# Patient Record
Sex: Female | Born: 1960 | Hispanic: Yes | Marital: Married | Smoking: Never smoker
Health system: Southern US, Community
[De-identification: ages and names within clinical notes are randomized; demographics above are authoritative.]

## PROBLEM LIST (undated history)

## (undated) DIAGNOSIS — K851 Biliary acute pancreatitis without necrosis or infection: Secondary | ICD-10-CM

## (undated) DIAGNOSIS — E785 Hyperlipidemia, unspecified: Secondary | ICD-10-CM

## (undated) DIAGNOSIS — K219 Gastro-esophageal reflux disease without esophagitis: Secondary | ICD-10-CM

## (undated) DIAGNOSIS — R7303 Prediabetes: Secondary | ICD-10-CM

## (undated) HISTORY — DX: Biliary acute pancreatitis without necrosis or infection: K85.10

## (undated) HISTORY — DX: Hyperlipidemia, unspecified: E78.5

---

## 2006-01-06 ENCOUNTER — Ambulatory Visit: Payer: Self-pay | Admitting: Family Medicine

## 2006-12-30 ENCOUNTER — Ambulatory Visit: Payer: Self-pay | Admitting: Family Medicine

## 2007-01-18 ENCOUNTER — Ambulatory Visit: Payer: Self-pay | Admitting: Family Medicine

## 2007-03-10 ENCOUNTER — Encounter: Payer: Self-pay | Admitting: Psychiatry

## 2007-04-05 ENCOUNTER — Encounter: Payer: Self-pay | Admitting: Psychiatry

## 2008-03-08 ENCOUNTER — Ambulatory Visit: Payer: Self-pay | Admitting: Family Medicine

## 2009-03-14 ENCOUNTER — Ambulatory Visit: Payer: Self-pay | Admitting: Family Medicine

## 2009-04-05 ENCOUNTER — Ambulatory Visit: Payer: Self-pay | Admitting: Family Medicine

## 2010-05-09 ENCOUNTER — Ambulatory Visit: Payer: Self-pay | Admitting: Family Medicine

## 2011-08-21 ENCOUNTER — Ambulatory Visit: Payer: Self-pay | Admitting: Orthopedic Surgery

## 2011-10-15 ENCOUNTER — Ambulatory Visit: Payer: Self-pay | Admitting: Unknown Physician Specialty

## 2011-10-15 LAB — URINALYSIS, COMPLETE
Bilirubin,UR: NEGATIVE
Glucose,UR: NEGATIVE mg/dL (ref 0–75)
Ketone: NEGATIVE
Nitrite: NEGATIVE
Ph: 6 (ref 4.5–8.0)
Protein: NEGATIVE
RBC,UR: 7 /HPF (ref 0–5)
Squamous Epithelial: 2

## 2011-10-15 LAB — CBC
HGB: 12.8 g/dL (ref 12.0–16.0)
MCH: 29.7 pg (ref 26.0–34.0)
RBC: 4.31 10*6/uL (ref 3.80–5.20)
WBC: 6.6 10*3/uL (ref 3.6–11.0)

## 2011-10-15 LAB — PREGNANCY, URINE: Pregnancy Test, Urine: NEGATIVE m[IU]/mL

## 2011-10-19 ENCOUNTER — Ambulatory Visit: Payer: Self-pay | Admitting: Unknown Physician Specialty

## 2011-10-19 HISTORY — PX: OTHER SURGICAL HISTORY: SHX169

## 2012-02-11 ENCOUNTER — Ambulatory Visit: Payer: Self-pay | Admitting: Family Medicine

## 2012-04-02 ENCOUNTER — Ambulatory Visit: Payer: Self-pay | Admitting: Physical Medicine and Rehabilitation

## 2013-02-13 ENCOUNTER — Ambulatory Visit: Payer: Self-pay | Admitting: Family Medicine

## 2014-06-28 DIAGNOSIS — K219 Gastro-esophageal reflux disease without esophagitis: Secondary | ICD-10-CM | POA: Insufficient documentation

## 2014-06-28 DIAGNOSIS — M778 Other enthesopathies, not elsewhere classified: Secondary | ICD-10-CM | POA: Insufficient documentation

## 2014-06-28 DIAGNOSIS — E785 Hyperlipidemia, unspecified: Secondary | ICD-10-CM | POA: Insufficient documentation

## 2014-07-19 DIAGNOSIS — M5020 Other cervical disc displacement, unspecified cervical region: Secondary | ICD-10-CM | POA: Insufficient documentation

## 2014-07-19 DIAGNOSIS — M5412 Radiculopathy, cervical region: Secondary | ICD-10-CM | POA: Insufficient documentation

## 2015-01-27 NOTE — Op Note (Signed)
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   PROCEDURE: Arthroscopic subacromial decompression along with rotator cuff Topaz procedure and lateral clavicle resection.   SURGEON: Alda Berthold., MD  ANESTHESIA: General.   HISTORY: The patient had sustained an injury to her right shoulder at work. She had had persistent pain. Plain films did not reveal any bony abnormality other than some AC joint degenerative changes. MRI was consistent with tendinosis of her supraspinatus tendon and possible small articular sided cuff tear along with rather severe AC joint traumatic or degenerative changes.   Because the patient had been refractory to conservative treatment, she was brought in for arthroscopy of her right shoulder.   Incidentally her AC joint was injected with steroid and anesthetic and this relieved a good bit of her symptomatology.   DESCRIPTION OF PROCEDURE: Patient was taken to Operating Room where satisfactory general anesthesia was achieved. The patient was turned to the lateral decubitus position with the right shoulder up. The right shoulder was prepped and draped in the usual fashion for an arthroscopic procedure. The shoulder was suspended with the Acufex shoulder suspension device. Shoulder was maintained in about 25 degrees of abduction and about 10 degrees of forward flexion. We used 7 pounds of traction throughout the procedure.   Incidentally the patient was given 2 grams of Kefzol IV prior to the start of the procedure.   The scope was introduced through a posterior portal into the glenohumeral joint. Joint was distended with lactated Ringer's. We used a Mitek pump to facilitate joint distention.   Inspection  of the glenohumeral joint revealed the labrum was intact. The biceps tendon was intact. There seemed to be some fraying in the area of the subscapularis attachment. The articular surfaces did seem to be normal, however. No loose bodies were appreciated. No obvious rotator cuff tear was appreciated.   I established an anterior portal from outside in and introduced a small shaver and used it to debride frayed portion of the subscapularis. I probed the anterior labrum. It appeared to be intact. Biceps tendon was also probed and there was no evidence for tendinopathy.   I then removed the scope from the glenohumeral joint and inserted it into the subacromial space. Patient was noted to have some fraying of the supraspinatus portion of the cuff. There appeared to be a small projection from the inferior aspect of the anterior acromion.   I established a lateral portal and introduced a shaver and debrided some of the thickened bursal tissue and debrided the frayed cuff. An angled ArthroCare wand was used to remove soft tissue from the undersurface of the acromion. The scope was switched to the lateral portal and brought an acromionizer bur in through the posterior portal and used it to perform a subacromial decompression. After this was performed the scope was switched back to the posterior portal and I brought the Topaz wand in through the lateral portal and used it to make multiple small puncture wounds in the area of the frayed supraspinatus insertion.   The cannulas were then removed from the subacromial space and the puncture wounds about the shoulder closed with 3-0 nylon in vertical mattress fashion. I then made about a 1.5-inch incision over the superior aspect of the [facility] joint. Dissection was carried  down through the subcutaneous tissue onto the lateral clavicle. I elevated the periosteum off the lateral clavicle and then used a reciprocating saw to remove about 1 to 1.5 cm of the lateral clavicle at the [facility]  joint. After the [facility] joint was decompressed the periosteum was closed with 0 Vicryl and the sub-Q with 2-0 Vicryl and the skin with skin staples.   Prior to the [facility] joint incision I did inject the area of incision with 4 to 5 mL of 0.5% Marcaine with epinephrine.   I also injected the puncture wounds with several milliliters of 0.5% Marcaine. Betadine was applied to the wounds followed by a sterile dressing. Four TENS pads were placed about the shoulder and then a sling was applied to the patient's right upper extremity. She was then turned supine and awakened and she was transferred to her stretcher bed and taken to recovery room in satisfactory condition. Blood loss was negligible.    ____________________________ Alda BertholdHarold B. Ingeborg Fite Jr., MD hbk:cms D: 10/19/2011 22:02:12 ET T: 10/20/2011 11:12:05 ET JOB#: [id number]  cc: Alda BertholdHarold B. Verdine Grenfell Jr., MD, <Dictator>  Randon GoldsmithHAROLD B Annina Piotrowski, Montez HagemanJR MD ELECTRONICALLY SIGNED 11/01/2011 15:14

## 2016-08-26 ENCOUNTER — Other Ambulatory Visit: Payer: Self-pay | Admitting: Family Medicine

## 2016-08-26 DIAGNOSIS — Z1231 Encounter for screening mammogram for malignant neoplasm of breast: Secondary | ICD-10-CM

## 2016-10-13 ENCOUNTER — Ambulatory Visit
Admission: RE | Admit: 2016-10-13 | Discharge: 2016-10-13 | Disposition: A | Discharge status: Home / Self Care | Payer: BLUE CROSS/BLUE SHIELD | Source: Ambulatory Visit | Attending: Family Medicine | Admitting: Family Medicine

## 2016-10-13 DIAGNOSIS — Z1231 Encounter for screening mammogram for malignant neoplasm of breast: Secondary | ICD-10-CM | POA: Insufficient documentation

## 2016-10-15 ENCOUNTER — Other Ambulatory Visit: Payer: Self-pay | Admitting: Family Medicine

## 2016-10-15 DIAGNOSIS — R928 Other abnormal and inconclusive findings on diagnostic imaging of breast: Secondary | ICD-10-CM

## 2016-10-29 ENCOUNTER — Encounter: Payer: Self-pay | Admitting: Radiology

## 2016-10-29 ENCOUNTER — Ambulatory Visit
Admission: RE | Admit: 2016-10-29 | Discharge: 2016-10-29 | Disposition: A | Discharge status: Home / Self Care | Payer: BLUE CROSS/BLUE SHIELD | Source: Ambulatory Visit | Attending: Family Medicine | Admitting: Family Medicine

## 2016-10-29 DIAGNOSIS — R928 Other abnormal and inconclusive findings on diagnostic imaging of breast: Secondary | ICD-10-CM | POA: Insufficient documentation

## 2018-09-18 ENCOUNTER — Other Ambulatory Visit: Payer: Self-pay

## 2018-09-18 ENCOUNTER — Emergency Department: Payer: BLUE CROSS/BLUE SHIELD

## 2018-09-18 ENCOUNTER — Emergency Department
Admission: EM | Admit: 2018-09-18 | Discharge: 2018-09-18 | Disposition: A | Discharge status: Home / Self Care | Payer: BLUE CROSS/BLUE SHIELD | Attending: Emergency Medicine | Admitting: Emergency Medicine

## 2018-09-18 DIAGNOSIS — R05 Cough: Secondary | ICD-10-CM | POA: Insufficient documentation

## 2018-09-18 DIAGNOSIS — J189 Pneumonia, unspecified organism: Secondary | ICD-10-CM | POA: Diagnosis not present

## 2018-09-18 DIAGNOSIS — R1110 Vomiting, unspecified: Secondary | ICD-10-CM | POA: Diagnosis present

## 2018-09-18 DIAGNOSIS — K219 Gastro-esophageal reflux disease without esophagitis: Secondary | ICD-10-CM | POA: Insufficient documentation

## 2018-09-18 LAB — CBC
HCT: 44.6 % (ref 36.0–46.0)
Hemoglobin: 14.3 g/dL (ref 12.0–15.0)
MCH: 28.4 pg (ref 26.0–34.0)
MCHC: 32.1 g/dL (ref 30.0–36.0)
MCV: 88.5 fL (ref 80.0–100.0)
NRBC: 0 % (ref 0.0–0.2)
PLATELETS: 237 10*3/uL (ref 150–400)
RBC: 5.04 MIL/uL (ref 3.87–5.11)
RDW: 13.3 % (ref 11.5–15.5)
WBC: 8.3 10*3/uL (ref 4.0–10.5)

## 2018-09-18 LAB — LIPASE, BLOOD: Lipase: 26 U/L (ref 11–51)

## 2018-09-18 LAB — COMPREHENSIVE METABOLIC PANEL
ALK PHOS: 83 U/L (ref 38–126)
ALT: 24 U/L (ref 0–44)
AST: 28 U/L (ref 15–41)
Albumin: 4.1 g/dL (ref 3.5–5.0)
Anion gap: 11 (ref 5–15)
BILIRUBIN TOTAL: 0.7 mg/dL (ref 0.3–1.2)
BUN: 14 mg/dL (ref 6–20)
CALCIUM: 8.7 mg/dL — AB (ref 8.9–10.3)
CO2: 21 mmol/L — ABNORMAL LOW (ref 22–32)
Chloride: 104 mmol/L (ref 98–111)
Creatinine, Ser: 0.76 mg/dL (ref 0.44–1.00)
GFR calc non Af Amer: >60 mL/min (ref >60)
Glucose, Bld: 151 mg/dL — ABNORMAL HIGH (ref 70–99)
Potassium: 3.5 mmol/L (ref 3.5–5.1)
Sodium: 136 mmol/L (ref 135–145)
TOTAL PROTEIN: 7.7 g/dL (ref 6.5–8.1)

## 2018-09-18 LAB — TROPONIN I

## 2018-09-18 MED ORDER — PREDNISONE 20 MG PO TABS: 60.0000 mg | ORAL_TABLET | Freq: Every day | ORAL | 0 refills | Status: AC

## 2018-09-18 MED ORDER — FAMOTIDINE 20 MG PO TABS
20.0000 mg | ORAL_TABLET | Freq: Once | ORAL | Status: AC
  Administered 2018-09-18: 20 mg via ORAL

## 2018-09-18 MED ORDER — SODIUM CHLORIDE 0.9 % IV BOLUS: 1000.0000 mL | Freq: Once | INTRAVENOUS | Status: DC

## 2018-09-18 MED ORDER — ONDANSETRON HCL 4 MG/2ML IJ SOLN: 4.0000 mg | Freq: Once | INTRAMUSCULAR | Status: DC

## 2018-09-18 MED ORDER — FAMOTIDINE IN NACL 20-0.9 MG/50ML-% IV SOLN: 20.0000 mg | Freq: Once | INTRAVENOUS | Status: DC

## 2018-09-18 MED ORDER — ALUM & MAG HYDROXIDE-SIMETH 200-200-20 MG/5ML PO SUSP
30.0000 mL | Freq: Once | ORAL | Status: AC
  Administered 2018-09-18: 30 mL via ORAL
  Filled 2018-09-18: qty 30

## 2018-09-18 MED ORDER — DOXYCYCLINE HYCLATE 100 MG PO TABS
100.0000 mg | ORAL_TABLET | Freq: Once | ORAL | Status: AC
  Administered 2018-09-18: 100 mg via ORAL
  Filled 2018-09-18: qty 1

## 2018-09-18 MED ORDER — PREDNISONE 20 MG PO TABS
60.0000 mg | ORAL_TABLET | Freq: Once | ORAL | Status: AC
  Administered 2018-09-18: 60 mg via ORAL
  Filled 2018-09-18: qty 3

## 2018-09-18 MED ORDER — DOXYCYCLINE HYCLATE 100 MG PO CAPS: 100.0000 mg | ORAL_CAPSULE | Freq: Two times a day (BID) | ORAL | 0 refills | Status: AC

## 2018-09-18 MED ORDER — LIDOCAINE VISCOUS HCL 2 % MT SOLN
15.0000 mL | Freq: Once | OROMUCOSAL | Status: AC
  Administered 2018-09-18: 15 mL via ORAL
  Filled 2018-09-18: qty 15

## 2018-09-18 MED ORDER — ONDANSETRON 4 MG PO TBDP
4.0000 mg | ORAL_TABLET | Freq: Once | ORAL | Status: AC
  Administered 2018-09-18: 4 mg via ORAL

## 2018-09-18 MED ORDER — FAMOTIDINE 20 MG PO TABS
20.0000 mg | ORAL_TABLET | Freq: Once | ORAL | Status: DC
  Filled 2018-09-18: qty 1

## 2018-09-18 MED ORDER — ONDANSETRON 4 MG PO TBDP
4.0000 mg | ORAL_TABLET | Freq: Once | ORAL | Status: DC
  Filled 2018-09-18: qty 1

## 2018-09-18 NOTE — ED Provider Notes (Signed)
[facility] Emergency Department Provider Note  ____________________________________________  Time seen: Approximately 8:07 AM  I have reviewed the triage vital signs and the nursing notes.   HISTORY  Chief Complaint Emesis and Cough   HPI Jessica Holden is a 57 y.o. female with history of GERD and hyperlipidemia who presents for evaluation of cough and vomiting.  Patient reports 2 days of dry cough.  She reports that last night she was coughing so forcefully that she ended up having one episode of vomiting.  She reports developing burning epigastric pain after that episode which has been constant since then.  The pain is mild to moderate in intensity.  No chest pain or shortness of breath, no diarrhea, no fever.  She has had chills.  No dysuria or hematuria.  PMH GERD HLD  Allergies Patient has no known allergies.  Family History  Problem Relation Age of Onset  . Breast cancer Neg Hx     Social History Smoking - no Alcohol - no Drugs - no  Review of Systems  Constitutional: Negative for fever. Eyes: Negative for visual changes. ENT: Negative for sore throat. Neck: No neck pain  Cardiovascular: Negative for chest pain. Respiratory: Negative for shortness of breath. + cough Gastrointestinal: + burning epigastric abdominal pain, nausea, and vomiting. No diarrhea. Genitourinary: Negative for dysuria. Musculoskeletal: Negative for back pain. Skin: Negative for rash. Neurological: Negative for headaches, weakness or numbness. Psych: No SI or HI  ____________________________________________   PHYSICAL EXAM:  VITAL SIGNS: ED Triage Vitals [09/18/18 0526]  Enc Vitals Group     BP 134/74     Pulse Rate 99     Resp 18     Temp 99.3 F (37.4 C)     Temp Source Oral     SpO2 97 %     Weight 170 lb (77.1 kg)     Height 5\' 4"  (1.626 m)     Head Circumference      Peak Flow      Pain Score 0     Pain Loc      Pain Edu?      Excl.  in GC?     Constitutional: Alert and oriented. Well appearing and in no apparent distress. HEENT:      Head: Normocephalic and atraumatic.         Eyes: Conjunctivae are normal. Sclera is non-icteric.       Mouth/Throat: Mucous membranes are moist.       Neck: Supple with no signs of meningismus. Cardiovascular: Regular rate and rhythm. No murmurs, gallops, or rubs. 2+ symmetrical distal pulses are present in all extremities. No JVD. Respiratory: Normal respiratory effort. Lungs are clear to auscultation bilaterally. No wheezes, crackles, or rhonchi.  Gastrointestinal: Soft, non tender, and non distended with positive bowel sounds. No rebound or guarding. Musculoskeletal: Nontender with normal range of motion in all extremities. No edema, cyanosis, or erythema of extremities. Neurologic: Normal speech and language. Face is symmetric. Moving all extremities. No gross focal neurologic deficits are appreciated. Skin: Skin is warm, dry and intact. No rash noted. Psychiatric: Mood and affect are normal. Speech and behavior are normal.  ____________________________________________   LABS (all labs ordered are listed, but only abnormal results are displayed)  Labs Reviewed  COMPREHENSIVE METABOLIC PANEL - Abnormal; Notable for the following components:      Result Value   CO2 21 (*)    Glucose, Bld 151 (*)    Calcium 8.7 (*)  All other components within normal limits  LIPASE, BLOOD  CBC  TROPONIN I  URINALYSIS, COMPLETE (UACMP) WITH MICROSCOPIC   ____________________________________________  EKG  ED ECG REPORT I, Nita Sicklearolina Shriley Joffe, the attending physician, personally viewed and interpreted this ECG.  Sinus tachycardia, rate of 101, normal intervals, normal axis, no ST elevations or depressions, T wave inversion in lead III. No prior for comparison ____________________________________________  RADIOLOGY  I have personally reviewed the images performed during this visit and I  agree with the Radiologist's read.   Interpretation by Radiologist:  Dg Chest 2 View  Result Date: 09/18/2018 CLINICAL DATA:  Abdominal pain and emesis. EXAM: CHEST - 2 VIEW COMPARISON:  None. FINDINGS: No pneumothorax. The cardiomediastinal silhouette is unremarkable. Coarsened lung markings. No nodule or mass. No focal infiltrate. No other acute abnormalities. IMPRESSION: Coarsened lung markings may be bronchitic or due to atypical infection. No focal infiltrate. Electronically Signed   By: Gerome Samavid  Williams III M.D   On: 09/18/2018 08:21     ____________________________________________   PROCEDURES  Procedure(s) performed: None Procedures Critical Care performed:  None ____________________________________________   INITIAL IMPRESSION / ASSESSMENT AND PLAN / ED COURSE   57 y.o. female with history of GERD and hyperlipidemia who presents for evaluation of cough and vomiting.  Patient is well-appearing, normal respiratory effort, normal sats, lungs are clear to auscultation, she is afebrile with no tachycardia, tachypnea, or hypoxia.  Abdomen is soft with no tenderness throughout.  #cough: bronchitis vs PNA. No fever, therefore less likely influenza.  We will do a chest x-ray and if that is negative will start patient on steroids.  If chest x-ray is positive will start patient on steroids and antibiotic.  #Epigastric burning abdominal pain: Started after patient had one episode of vomiting.  She has a history of GERD which is most likely the culprit.  Patient has no tenderness and abdomen is soft otherwise.  Will get an EKG and troponin. CMP and lipase WNL. Will give GI cocktail, pepcid, and zofran.    _________________________ 9:15 AM on 09/18/2018 -----------------------------------------  Chest x-ray concerning for bronchitis versus atypical pneumonia.  Patient was started on doxycycline and prednisone.  Remains afebrile with no signs of sepsis.  Discussed close follow-up with  primary care doctor and signs of worsening infection.  Patient's epigastric burning abdominal pain has resolved with a GI cocktail, Pepcid and Zofran.  EKG and troponin with no ischemic changes.   As part of my medical decision making, I reviewed the following data within the electronic MEDICAL RECORD NUMBER Nursing notes reviewed and incorporated, Labs reviewed , EKG interpreted , Old chart reviewed, Radiograph reviewed , Notes from prior ED visits and [place] Controlled Substance Database    Pertinent labs & imaging results that were available during my care of the patient were reviewed by me and considered in my medical decision making (see chart for details).    ____________________________________________   FINAL CLINICAL IMPRESSION(S) / ED DIAGNOSES  Final diagnoses:  Atypical pneumonia  Gastroesophageal reflux disease, esophagitis presence not specified      NEW MEDICATIONS STARTED DURING THIS VISIT:  ED Discharge Orders         Ordered    doxycycline (VIBRAMYCIN) 100 MG capsule  2 times daily     09/18/18 0914    predniSONE (DELTASONE) 20 MG tablet  Daily     09/18/18 0914           Note:  This document was prepared using Dragon voice recognition software and  may include unintentional dictation errors.    Nita Sickle, MD 09/18/18 [phone]

## 2018-09-18 NOTE — ED Triage Notes (Signed)
Patient to triage via wheelchair by EMS.  Patient reports abdominal burning and vomiting.  Also reports cough for 2 weeks.

## 2018-09-18 NOTE — ED Notes (Signed)
Pt taken to xray via stretcher  

## 2020-03-26 ENCOUNTER — Other Ambulatory Visit: Payer: Self-pay | Admitting: Family Medicine

## 2020-03-26 DIAGNOSIS — Z1231 Encounter for screening mammogram for malignant neoplasm of breast: Secondary | ICD-10-CM

## 2020-05-31 ENCOUNTER — Other Ambulatory Visit: Payer: Self-pay

## 2020-05-31 ENCOUNTER — Ambulatory Visit
Admission: RE | Admit: 2020-05-31 | Discharge: 2020-05-31 | Disposition: A | Discharge status: Home / Self Care | Payer: BLUE CROSS/BLUE SHIELD | Source: Ambulatory Visit | Attending: Family Medicine | Admitting: Family Medicine

## 2020-05-31 DIAGNOSIS — Z1231 Encounter for screening mammogram for malignant neoplasm of breast: Secondary | ICD-10-CM | POA: Insufficient documentation

## 2021-06-03 DIAGNOSIS — R200 Anesthesia of skin: Secondary | ICD-10-CM | POA: Insufficient documentation

## 2021-06-10 ENCOUNTER — Encounter: Payer: Self-pay | Admitting: Physical Therapy

## 2021-06-10 ENCOUNTER — Ambulatory Visit: Payer: BLUE CROSS/BLUE SHIELD | Attending: Physical Medicine & Rehabilitation | Admitting: Physical Therapy

## 2021-06-10 DIAGNOSIS — M5412 Radiculopathy, cervical region: Secondary | ICD-10-CM | POA: Diagnosis present

## 2021-06-10 DIAGNOSIS — M6281 Muscle weakness (generalized): Secondary | ICD-10-CM

## 2021-06-10 DIAGNOSIS — M542 Cervicalgia: Secondary | ICD-10-CM

## 2021-06-10 DIAGNOSIS — M25521 Pain in right elbow: Secondary | ICD-10-CM | POA: Diagnosis present

## 2021-06-10 NOTE — Therapy (Signed)
[facility] [address] [place], [address] Phone: [phone]   Fax:  [phone]  Physical Therapy Evaluation  Patient Details  Name: Jessica Holden MRN: [id number] Date of Birth: 04/21/1961 Referring Provider (PT): Filomena Jungling, MD  Encounter Date: 06/10/2021   PT End of Session - 06/11/21 0946     Visit Number 1    Number of Visits 24    Date for PT Re-Evaluation 09/02/21    Authorization Type [employer] reporting period from 06/10/2021    Authorization - Visit Number 1    Authorization - Number of Visits 30    Progress Note Due on Visit 10    PT Start Time 1303    PT Stop Time 1345    PT Time Calculation (min) 42 min    Activity Tolerance Patient tolerated treatment well    Behavior During Therapy [facility] for tasks assessed/performed             Past Medical History:  Diagnosis Date   Hyperlipidemia     Past Surgical History:  Procedure Laterality Date   ARTHROSCOPIC ROTATOR CUFF REPAIR Right 10/19/2011    There were no vitals filed for this visit.    Subjective Assessment - 06/10/21 1342     Subjective Patient is here with live Spanish interpreter who assisted with communication throughout session. Patient states she is concerned she needs an x-ray because it feels like her R elbow is out of place. Patient states she was in so much discomfort that she went to a masseuse and got a massage who worked on her neck area and she feels much better in her left arm and now she can move it. She has continued discomfort in the right arm. She states she could not move her arms much prior to the massage but she can now. Massage was on Saturday but yesterday when she got up she was starting to have pain again. She states her pain started 2-3 years ago for no apparent reason. She denies injury/accident. States her arms are numb and cannot feel her left hand and her left thumb/index finger is  in pain when she tries to grab. Feels she cannot grip things L > R. About 10 years ago, she was lifting a heavy piece of machinery and it slipped and she tried to grab it and the three tendons on the right shoulder snap and she had surgery on them. States her shoulder did recover from that. Patient describes her current pain at bilateral lower neck, chest, down the back, equally both sides. Feels like both arms are hanging and she cannot lift them. Pain is worst when she is lifting things. She does have pain on the right and left down the the wrist and hands (points to anterior surface of medial right  wrist and over thumb/2nd fingers). Describes numbness in B UE L > R. Numbness is constant. Worse with sleeping (develops into pain). Getting up and walking helps which she does at night to relieve pain. Pain is over the thumb and index finger bilaterally. Pain in the chest and arms is intermittent. When she does something it feels stressed and hurts more.  Neck pain is constant and feels stressed. She sometimes has headaches as well. She has these headaches 2-3 times a week. Does report bladder incontinence and a feeling like something is in her vagina that has not changed with neck pain onset or fluctuation. Reports the previously  had some low back pain and feeling like her legs did not have the strength to stand up and her PCP checked her out for that. She got PT for that area and it seems to have improved (at end of visit states she felt that she could hardly stand up due to feeling of weakness/numbness in R LE following exam). States she saw a chiropractor that checked her spine including her neck and said the problem was not from her spine. She has lots of questions about how PT could help and concerns about possibly needing other care. Patient states pain goes away with tylenol and another medication her docter gives her but still tense. 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 R Elbow: patient states  she feels like she can palpate a nerve at the right elbow near the olecranon and is worried there is an nerve pinched there. Thinks the elbow might be a different problem than her neck/arm pain. This prevents her from lifting things with R UE (uses L UE to assist). When she sleeps on the right side she cannot stretch it any more and has to help with the left side. States this has been going on about half a year for no apparent reason. 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 Currently on medical leave for 3 months to care for her ailing husband (started end of Aug; could be up to a year). She is not on medical leave for her own medical issues.    Patient is accompained by: Interpreter    Pertinent History Patient is a 60 y.o. female who presents to outpatient physical therapy with a referral for medical diagnosis cervicalgia and cervical radiculopathy. This patient's chief complaints consist of constant lower neck pain and B UE paresthesia with intermittent headaches and pain to B hands and down back. Also complains of intermittent R sided LE numbness and R elbow pain and feeling it is "dislocated." Upper quarter, neck, head complaints lead to the following functional deficits: Difficulty with activities that require use of B UE including ADLs, working, reaching, lifting, carrying, sleeping, sitting. Relevant past medical history and comorbidities include ARTHROSCOPIC ROTATOR CUFF REPAIR Right 10/19/11, hyperlipidemia, overweight, severe B CTS, hx of low back pain with radicular symptoms.   Patient denies hx of cancer, stroke, spinal surgeries, lung problems, HTN, heart problems, diabetes, unexplained weight loss. Doe note some urinary incontinence and feeling of something in her vagina that has not changed with neck symptoms.    Limitations Sitting;Lifting;House hold activities;Other (comment)   Functional  Limitations: Difficulty with activities that require use of B UE including ADLs, working, reaching, lifting, carrying, sleeping, sitting.   Diagnostic tests States she had a nerve conduction study at the wrists but she stopped them from doing the neck. Results not available to PT at this time.    Patient Stated Goals would like to improve her problems in her neck and arms by the time she needs to go back to work.    Currently in Pain? Yes    Pain Score 8    W: 8/10; B: 0/10   Pain Location Neck   base of neck constant pain; B UE constant numbness; intermittant pain to B UE and headaches   Pain Orientation Right;Left    Pain Descriptors / Indicators Numbness   electrical   Pain Type Chronic pain    Pain Radiating Towards  B hands on radial side; headache    Pain Onset More than a month ago    Pain Frequency Constant    Aggravating Factors  using UEs, sleeping, laying on right side    Pain Relieving Factors tylenol and other medication MD gave, getting up and walking around at night, massage    Effect of Pain on Daily Activities Functional Limitations: Difficulty with activities that require use of B UE including ADLs, working, reaching, lifting, carrying, sleeping, sitting.               [facility] PT Assessment - 06/11/21 0001       Assessment   Medical Diagnosis cervicalgia, cervical radiculopathy    Referring Provider (PT) Filomena Jungling, MD    Onset Date/Surgical Date --   2-3 years ago   Prior Therapy PT for low back that pt states was successful      Prior Function   Level of Independence Independent    Vocation Other (comment)   Was working until Sep 2022, now on leave to assist husband in poor health for 3-12 months.   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 Can sit in backless chair and sit <> stand a lot, continuous use of B UE    Leisure cares for husband      Cognition   Overall Cognitive Status Within Functional Limits  for tasks assessed              OBJECTIVE  OBSERVATION/INSPECTION Posture: forward head, rounded shoulders, slumped in sitting, hyperkyphosis at CT junction.   Posture correction: increased pain in shoulders and low back.  Tremor: none Muscle bulk: appears WFL   Bed mobility: not tested. Transfers: sit <> stand I but patient complains she felt she might not be able to stand due to R LE paresthesia after prolonged sitting for cervical spine exam.  Gait: grossly WFL for household and short community ambulation. More detailed gait analysis deferred to later date as needed. Complains of R LE paresthesia after prolonged sitting for cervical spine exam.  Stairs: not tested  NEUROLOGICAL Dermatomes C2-T1 appears equal to light touch, unclear if diminished. Pt reports constant numbness in B UE.   SPINE MOTION Cervical Spine AROM *denotes pain Flexion: = 55*. Extension: = 45*. Rotation: R= 50*, L = 50* Side Flexion: R= 35, L = 40.  PERIPHERAL JOINT MOTION (in degrees) Active Range of Motion (AROM) B shoulder elevation (flexion/abduction) limited beyond about 120 degrees. FIR, ER WFL except all motions painful (especially elevation).  MUSCLE PERFORMANCE (MMT):  *Indicates pain 06/10/21 Date Date  Joint/Motion R/L R/L R/L  Shoulder     Elevation 4+/4+    Flexion 3+*/3* / /  Abduction 4*/4* / /  External rotation 3*/4 / /  Internal rotation 3+*/3+* / /  Elbow     Flexion 4/4 / /  Extension 3+*/3 / /   SPECIAL TESTS: Axial compression: negative Spurling's B: R = increased left sided pain at neck, L = increased left sided pain at neck. (No effect on B UE for test to R or L).  Axial distraction: positive for local pain relief (no effect on either UE).    PALPATION Tender and tight at cervical spine and upper thoracic musculature. More thorough exam planned at future sessions.   Objective measurements completed on examination: See above findings.   Exercise purpose/form.  Self management techniques. Education on diagnosis, prognosis, POC, anatomy and physiology of  current condition Education on HEP including handout               PT Education - 06/11/21 0944     Education Details Education on diagnosis, prognosis, POC, anatomy and physiology of current condition    Person(s) Educated Patient    Methods Explanation;Demonstration;Tactile cues;Verbal cues    Comprehension Verbalized understanding;Returned demonstration;Verbal cues required;Tactile cues required;Need further instruction              PT Short Term Goals - 06/11/21 0947       PT SHORT TERM GOAL #1   Title Be independent with initial home exercise program for self-management of symptoms.    Baseline Initial HEP to be provided at visit 2 as appropriate (06/10/2021);    Time 2    Period Weeks    Status New    Target Date 06/25/21               PT Long Term Goals - 06/11/21 0949       PT LONG TERM GOAL #1   Title Be independent with a long-term home exercise program for self-management of symptoms.    Baseline Initial HEP to be provided at visit 2 as appropriate (06/10/2021);    Time 12    Period Weeks    Status New   TARGET DATE FOR ALL LONG TERM GOALS: 09/02/2021     PT LONG TERM GOAL #2   Title Demonstrate improved FOTO score by 10 units to demonstrate improvement in overall condition and self-reported functional ability.    Baseline to be measured visit 2 as appropriate (06/10/2021);    Time 12    Period Weeks    Status New      PT LONG TERM GOAL #3   Title Have full bilateral shoulder AROM with no compensations or increase in pain in all planes except intermittent end range discomfort to allow patient to complete valued activities such as reaching with less difficulty.    Baseline overhead ROM limited - see objective (06/11/2021);    Time 12    Period Weeks    Status New      PT LONG TERM GOAL #4   Title Improve B UE strength to equal or greater than 4+/5 with  no increase in pain to improve patient's ability to complete  valued functional tasks working, lifting, carrying, reaching with less difficulty.    Baseline Ranges from 3/5 to 4+/5 and increases pain - see objective (06/10/2021);    Time 12    Period Weeks    Status New      PT LONG TERM GOAL #5   Title Complete community, work and/or recreational activities such as those that require use of B UE including ADLs, working, reaching, lifting, carrying, sleeping, sitting without limitation due to current condition.    Baseline currently reported as funcitonal limitations (06/10/2021);    Time 12    Period Weeks    Status New      Additional Long Term Goals   Additional Long Term Goals Yes      PT LONG TERM GOAL #6   Title Reduce pain with functional activities to equal or less than 3/10 to allow patient to complete usual activities including ADLs, IADLs, work, and social engagement with less difficulty.    Baseline worst 8/10 (06/10/2021);    Time 12    Period Weeks    Status New  Plan - 06/11/21 1009     Clinical Impression Statement Patient is a 60 y.o. female referred to outpatient physical therapy with a medical diagnosis of cervicalgia and cervical radiculopathy who presents with signs and symptoms consistent with chronic neck pain with bilateral cervical radiculopathy. Examination today limited by significant time taken addressing patient's questions and listening to concerns and additional time required for interpretation between [place] and [place] language. Patient also presents with R elbow pain that warrants further PT exam to assess whether it is related to radiculopathy or another distinct condition. Patient's complaint of hand symptoms likely have a contribution from severe CTS noted by referring clinician. Unable to produce changes in distal UE symptoms during spine testing today, and will follow up with more specific tests including spring testing at the  cervical and upper thoracic spine, and more neurodynamic testing. Patient's poor posture, repetitive work likely contribute to condition. Patient presents with significant pain, ROM, knowledge, posture, paresthesia, muscle performance (strength/endurance/power), and activity tolerance impairments that are limiting ability to complete her usual activities such as those that require use of B UE including ADLs, working, reaching, lifting, carrying, sleeping, sitting without difficulty and decrease her quality of life. Patient will benefit from skilled physical therapy intervention to address current body structure impairments and activity limitations to improve function and work towards goals set in current POC in order to return to prior level of function or maximal functional improvement.    Personal Factors and Comorbidities Comorbidity 3+;Social Background;Profession;Past/Current Experience;Time since onset of injury/illness/exacerbation;Fitness;Age    Comorbidities ARTHROSCOPIC ROTATOR CUFF REPAIR Right 10/19/11, hyperlipidemia, overweight, severe B CTS, hx of low back pain with radiculopathy    Examination-Activity Limitations Lift;Hygiene/Grooming;Caring for Others;Reach Overhead;Carry;Sleep    Examination-Participation Restrictions Community Activity;Occupation;Cleaning;Interpersonal Relationship;Other    Stability/Clinical Decision Making Evolving/Moderate complexity    Clinical Decision Making Moderate    Rehab Potential Fair    PT Duration 12 weeks    PT Treatment/Interventions ADLs/Self Care Home Management;Aquatic Therapy;Cryotherapy;Moist Heat;Electrical Stimulation;Therapeutic activities;Therapeutic exercise;Neuromuscular re-education;Manual techniques;Dry needling;Spinal Manipulations;Joint Manipulations;Passive range of motion    PT Next Visit Plan establish initial HEP, continue exam of neurodynamics, accessory motion    PT Home Exercise Plan TBD    Consulted and Agree with Plan of Care  Patient             Patient will benefit from skilled therapeutic intervention in order to improve the following deficits and impairments:  Improper body mechanics, Pain, Postural dysfunction, Decreased activity tolerance, Decreased endurance, Decreased range of motion, Decreased strength, Hypomobility, Impaired perceived functional ability, Impaired UE functional use, Impaired sensation  Visit Diagnosis: Cervicalgia  Radiculopathy, cervical region  Muscle weakness (generalized)  Pain in right elbow     Problem List There are no problems to display for this patient.   Luretha MurphySara R. Ilsa IhaSnyder, PT, DPT 06/11/21, 10:29 AM   [facility] [address] , [place], [address] Phone: [phone]   Fax:  [phone]  Name: Jessica QueryGumercinda Holden MRN: [id number] Date of Birth: 02-10-1961

## 2021-06-11 ENCOUNTER — Encounter: Payer: Self-pay | Admitting: Physical Therapy

## 2021-06-12 ENCOUNTER — Encounter: Payer: Self-pay | Admitting: Physical Therapy

## 2021-06-12 ENCOUNTER — Ambulatory Visit: Payer: BLUE CROSS/BLUE SHIELD | Admitting: Physical Therapy

## 2021-06-12 DIAGNOSIS — M25521 Pain in right elbow: Secondary | ICD-10-CM

## 2021-06-12 DIAGNOSIS — M542 Cervicalgia: Secondary | ICD-10-CM | POA: Diagnosis not present

## 2021-06-12 DIAGNOSIS — M6281 Muscle weakness (generalized): Secondary | ICD-10-CM

## 2021-06-12 DIAGNOSIS — M5412 Radiculopathy, cervical region: Secondary | ICD-10-CM

## 2021-06-12 NOTE — Therapy (Signed)
**Note Jessica-Identified via Obfuscation** [facility] [address] [place], [address] Phone: [phone]   Fax:  [phone]  Physical Therapy Treatment  Patient Details  Name: Jessica Holden MRN: [id number] Date of Birth: 1960-12-03 Referring Provider (PT): Filomena Jungling, MD   Encounter Date: 06/12/2021   PT End of Session - 06/12/21 1749     Visit Number 2    Number of Visits 24    Date for PT Re-Evaluation 09/02/21    Authorization Type [employer] reporting period from 06/10/2021    Authorization - Visit Number 2    Authorization - Number of Visits 30    Progress Note Due on Visit 10    PT Start Time 0900    PT Stop Time 0945    PT Time Calculation (min) 45 min    Activity Tolerance Patient tolerated treatment well    Behavior During Therapy [facility] for tasks assessed/performed             Past Medical History:  Diagnosis Date   Hyperlipidemia     Past Surgical History:  Procedure Laterality Date   ARTHROSCOPIC ROTATOR CUFF REPAIR Right 10/19/2011    There were no vitals filed for this visit.   Subjective Assessment - 06/12/21 0900     Subjective Patient is here with live Spanish interpretor who assisted with communication throughout session. She reports that when she got up today she had difficulty getting up because of her right leg. States that after last PT session she was sore in the right low back after she did the exercises she was instructed to do after last PT session. States she has pain across her chest and her right elbow is still painful with occasional sharp pain. She currently has numbness in her left hand. Feels like both arms are hanging like they have not strength. States her pain is currently about 7/10 at the right arm, which is the worst spot. Patient clarifies that the PT she had in the past that was focused on the back and she feels it helped her legs too including especially her right leg. Reports  it is harder for her to get up after she is sitting a long time and her right leg feels tight. when she went to kneel at church on sunday it felt like both anterior thighs were cramping and right hamstring area too. Does not usually get cramps.    Patient is accompained by: Interpreter    Pertinent History Patient is a 60 y.o. female who presents to outpatient physical therapy with a referral for medical diagnosis cervicalgia and cervical radiculopathy. This patient's chief complaints consist of constant lower neck pain and B UE paresthesia with intermittent headaches and pain to B hands and down back. Also complains of intermittent R sided LE numbness and R elbow pain and feeling it is "dislocated." Upper quarter, neck, head complaints lead to the following functional deficits: Difficulty with activities that require use of B UE including ADLs, working, reaching, lifting, carrying, sleeping, sitting. Relevant past medical history and comorbidities include ARTHROSCOPIC ROTATOR CUFF REPAIR Right 10/19/11, hyperlipidemia, overweight, severe B CTS, hx of low back pain with radicular symptoms.   Patient denies hx of cancer, stroke, spinal surgeries, lung problems, HTN, heart problems, diabetes, unexplained weight loss. Doe note some urinary incontinence and feeling of something in her vagina that has not changed with neck symptoms.    Limitations Sitting;Lifting;House hold activities;Other (comment)   Functional Limitations: Difficulty  with activities that require use of B UE including ADLs, working, reaching, lifting, carrying, sleeping, sitting.   Diagnostic tests States she had a nerve conduction study at the wrists but she stopped them from doing the neck. Results not available to PT at this time.    Patient Stated Goals would like to improve her problems in her neck and arms by the time she needs to go back to work.    Currently in Pain? Yes    Pain Score 7     Pain Onset More than a month ago              OBJECTIVE  NEUROLOGICAL  Upper Motor Neuron Screen Babinski, Hoffman's, and Clonus (ankle) negative bilaterally  Deep Tendon Reflexes R/L  1+/1+ Biceps brachii reflex (C5, C6) 1+/0+ Triceps brachii reflex (C7) 2+/2+ Quadriceps reflex (L4) 0+/0+ Achilles reflex (S1)  Neurodynamic Tests   Upper limb tension test:  Medial nerve bias: R = positive for posterior elbow pain relived with wrist flexion or elbow flexion. L = positive for pain at thenar eminence and first three digits on palmar side, relived with elbow flexion but not wrist flexion.  Radial nerve bias: R = positive for posterior elbow pain relived with wrist extension or elbow flexion. L = positive for pain at the dorsal surface of wrist, relieved with wrist extension but not elbow flexion. Ulnar nerve: R = positive for posterior elbow pain relived with wrist flexion or elbow extension. L =  positive for pulling at finger digits 4-5, relived with elbow extension or wrist flexion.  PERIPHERAL JOINT MOTION (in degrees)  Active Range of Motion (AROM) *Indicates pain 06/12/21 Date Date  Joint/Motion R/L R/L R/L  Elbow     Flexion 138*/0 / /  Extension -30/145 / /  Comments: R elbow flexion causes pulling pain from posterior elbow across dorsum of R wrist to thumb and index finger. R elbow extension causes pain at anterior surface of forearm from elbow to wrist.   Passive Range of Motion (PROM) Comments:  R Elbow:  Flexion with OP: firm end feel with sensation of pulling from triceps to fingers at posteror surface. Pain over right anterior shoulder and chest after release.  Extension with OP: hard end feel with pain reported at anterior forearm/elbow and posterior elbow.  Pronation with OP and eversion with OP: firm end feel and normal motion with reported pain from radial styloid process over forearm to lateral elbow.   TREATMENT:   Therapeutic exercise: to centralize symptoms and improve ROM, strength, muscular  endurance, and activity tolerance required for successful completion of functional activities.  - extensive education on central and peripheral nervous systems and rationale for exam and potential structures that could lead to pain and symptoms and how they connect or don't connect to help patient make sense of education she has received from prior healthcare professionals and to allow her to understand current exam and treatment.  - further examination to further elucidate patient's complaints and relevance to cervicalgia and cervical radiculopathy.  - seated AROM nerve glide, slider technique, for median nerve bias with instructions on how to modify if necessary due to sensitivity.  - Education on HEP  Pt required multimodal cuing for proper technique and to facilitate improved neuromuscular control, strength, range of motion, and functional ability resulting in improved performance and form.  HOME EXERCISE PROGRAM Verbally given: median nerve glides, slider technique, 1x10, 3 times a day    PT Education - 06/12/21 1914  Education Details Extensive education on diagnosis, prognosis, POC, anatomy and physiology of current condition. Rationale for exam.    Person(s) Educated Patient    Methods Explanation;Demonstration;Tactile cues;Verbal cues    Comprehension Verbalized understanding;Returned demonstration;Verbal cues required;Tactile cues required;Need further instruction              PT Short Term Goals - 06/11/21 0947       PT SHORT TERM GOAL #1   Title Be independent with initial home exercise program for self-management of symptoms.    Baseline Initial HEP to be provided at visit 2 as appropriate (06/10/2021);    Time 2    Period Weeks    Status New    Target Date 06/25/21               PT Long Term Goals - 06/11/21 0949       PT LONG TERM GOAL #1   Title Be independent with a long-term home exercise program for self-management of symptoms.    Baseline Initial  HEP to be provided at visit 2 as appropriate (06/10/2021);    Time 12    Period Weeks    Status New   TARGET DATE FOR ALL LONG TERM GOALS: 09/02/2021     PT LONG TERM GOAL #2   Title Demonstrate improved FOTO score by 10 units to demonstrate improvement in overall condition and self-reported functional ability.    Baseline to be measured visit 2 as appropriate (06/10/2021);    Time 12    Period Weeks    Status New      PT LONG TERM GOAL #3   Title Have full bilateral shoulder AROM with no compensations or increase in pain in all planes except intermittent end range discomfort to allow patient to complete valued activities such as reaching with less difficulty.    Baseline overhead ROM limited - see objective (06/11/2021);    Time 12    Period Weeks    Status New      PT LONG TERM GOAL #4   Title Improve B UE strength to equal or greater than 4+/5 with no increase in pain to improve patient's ability to complete  valued functional tasks working, lifting, carrying, reaching with less difficulty.    Baseline Ranges from 3/5 to 4+/5 and increases pain - see objective (06/10/2021);    Time 12    Period Weeks    Status New      PT LONG TERM GOAL #5   Title Complete community, work and/or recreational activities such as those that require use of B UE including ADLs, working, reaching, lifting, carrying, sleeping, sitting without limitation due to current condition.    Baseline currently reported as funcitonal limitations (06/10/2021);    Time 12    Period Weeks    Status New      Additional Long Term Goals   Additional Long Term Goals Yes      PT LONG TERM GOAL #6   Title Reduce pain with functional activities to equal or less than 3/10 to allow patient to complete usual activities including ADLs, IADLs, work, and social engagement with less difficulty.    Baseline worst 8/10 (06/10/2021);    Time 12    Period Weeks    Status New                   Plan - 06/12/21 1949      Clinical Impression Statement Patient arrives with live interpreter who assists with  communication throughout session. Extensive time spent on education to help patient make sense of the various things she has been told about her condition, to help her understand PT's approach, exam process, and potential for improvement with PT. Patient appeared to feel better and verbalized understanding. Further objective exam focused on screening for cervical myelopathy and further exam of right elbow to help determine if it is related to cervical radiculopathy, CTS, or another distinct condition. Patient does appear to have low back pain with right sided radiculopathy but was negative for upper motor neuron screen. Patient does have restrictions in elbow extension that suggest an intra-articular problem at the elbow but she reported radiating pain and concordant elbow pain was produced with ULTT suggesting radicular or neurogenic contribution to pain. Plan to treat cervical radiculopathy first and further explore elbow if it fails to improve with other radicular symptoms. Beginning HEP with gentle nerve glides provided today. Plan to continue with manual therapy and exercises for neck and posture next session. Patient would benefit from continued management of limiting condition by skilled physical therapist to address remaining impairments and functional limitations to work towards stated goals and return to PLOF or maximal functional independence.    Personal Factors and Comorbidities Comorbidity 3+;Social Background;Profession;Past/Current Experience;Time since onset of injury/illness/exacerbation;Fitness;Age    Comorbidities ARTHROSCOPIC ROTATOR CUFF REPAIR Right 10/19/11, hyperlipidemia, overweight, severe B CTS, hx of low back pain with radiculopathy    Examination-Activity Limitations Lift;Hygiene/Grooming;Caring for Others;Reach Overhead;Carry;Sleep    Examination-Participation Restrictions Community  Activity;Occupation;Cleaning;Interpersonal Relationship;Other    Stability/Clinical Decision Making Evolving/Moderate complexity    Rehab Potential Fair    PT Duration 12 weeks    PT Treatment/Interventions ADLs/Self Care Home Management;Aquatic Therapy;Cryotherapy;Moist Heat;Electrical Stimulation;Therapeutic activities;Therapeutic exercise;Neuromuscular re-education;Manual techniques;Dry needling;Spinal Manipulations;Joint Manipulations;Passive range of motion    PT Next Visit Plan update HEP as appropriate, manual therapy as needed, postural strengthning, continue exam of accessory motion    PT Home Exercise Plan Verbally given: median nerve glides, slider technique, 1x10, 3 times a day    Consulted and Agree with Plan of Care Patient             Patient will benefit from skilled therapeutic intervention in order to improve the following deficits and impairments:  Improper body mechanics, Pain, Postural dysfunction, Decreased activity tolerance, Decreased endurance, Decreased range of motion, Decreased strength, Hypomobility, Impaired perceived functional ability, Impaired UE functional use, Impaired sensation  Visit Diagnosis: Cervicalgia  Radiculopathy, cervical region  Muscle weakness (generalized)  Pain in right elbow     Problem List There are no problems to display for this patient.   Luretha Murphy. Ilsa Iha, PT, DPT 06/12/21, 7:52 PM   [facility] PH[facility] AND SPORTS MEDICINE [address], [place], [address] Phone: [phone]   Fax:  [phone]  Name: Jasiya Markie MRN: [id number] Date of Birth: October 28, 1960

## 2021-06-16 ENCOUNTER — Ambulatory Visit: Payer: BLUE CROSS/BLUE SHIELD | Admitting: Physical Therapy

## 2021-06-16 ENCOUNTER — Encounter: Payer: Self-pay | Admitting: Physical Therapy

## 2021-06-16 DIAGNOSIS — M542 Cervicalgia: Secondary | ICD-10-CM

## 2021-06-16 DIAGNOSIS — M25521 Pain in right elbow: Secondary | ICD-10-CM

## 2021-06-16 DIAGNOSIS — M5412 Radiculopathy, cervical region: Secondary | ICD-10-CM

## 2021-06-16 DIAGNOSIS — M6281 Muscle weakness (generalized): Secondary | ICD-10-CM

## 2021-06-16 NOTE — Therapy (Signed)
[facility] MEDICINE [address] [place], [address] Phone: [phone]   Fax:  [phone]  Physical Therapy Treatment  Patient Details  Name: Jessica Holden MRN: [id number] Date of Birth: 30-Nov-1960 Referring Provider (PT): Filomena Jungling, MD   Encounter Date: 06/16/2021   PT End of Session - 06/16/21 1235     Visit Number 3    Number of Visits 24    Date for PT Re-Evaluation 09/02/21    Authorization Type [employer] reporting period from 06/10/2021    Authorization - Visit Number 3    Authorization - Number of Visits 30    Progress Note Due on Visit 10    PT Start Time 0900    PT Stop Time 0945    PT Time Calculation (min) 45 min    Activity Tolerance Patient tolerated treatment well    Behavior During Therapy [facility] for tasks assessed/performed             Past Medical History:  Diagnosis Date   Hyperlipidemia     Past Surgical History:  Procedure Laterality Date   ARTHROSCOPIC ROTATOR CUFF REPAIR Right 10/19/2011    There were no vitals filed for this visit.   Subjective Assessment - 06/16/21 0943     Subjective Patient here with in-person Spanish interpreter who assisted with communication throughout session. Patient reports she is well but she is still having a lot of problems with pain over her chest and upper back and arms. States her HEP went well but when she does the exercise it does hurt a lot in her right elbow. She does not think the exercises have helped it go away. It does not feel worse afterwards. Rates her pain 7/10 worst over the right elbow.    Patient is accompained by: Interpreter    Pertinent History Patient is a 60 y.o. female who presents to outpatient physical therapy with a referral for medical diagnosis cervicalgia and cervical radiculopathy. This patient's chief complaints consist of constant lower neck pain and B UE paresthesia with intermittent headaches and  pain to B hands and down back. Also complains of intermittent R sided LE numbness and R elbow pain and feeling it is "dislocated." Upper quarter, neck, head complaints lead to the following functional deficits: Difficulty with activities that require use of B UE including ADLs, working, reaching, lifting, carrying, sleeping, sitting. Relevant past medical history and comorbidities include ARTHROSCOPIC ROTATOR CUFF REPAIR Right 10/19/11, hyperlipidemia, overweight, severe B CTS, hx of low back pain with radicular symptoms.   Patient denies hx of cancer, stroke, spinal surgeries, lung problems, HTN, heart problems, diabetes, unexplained weight loss. Doe note some urinary incontinence and feeling of something in her vagina that has not changed with neck symptoms.    Limitations Sitting;Lifting;House hold activities;Other (comment)   Functional Limitations: Difficulty with activities that require use of B UE including ADLs, working, reaching, lifting, carrying, sleeping, sitting.   Diagnostic tests States she had a nerve conduction study at the wrists but she stopped them from doing the neck. Results not available to PT at this time.    Patient Stated Goals would like to improve her problems in her neck and arms by the time she needs to go back to work.    Currently in Pain? Yes    Pain Score 7     Pain Location Elbow    Pain Orientation Right    Pain Type Chronic pain  Pain Onset More than a month ago            OBJECTIVE FOTO: 51/100  SPECIAL TESTS Tinel's at carpal tunnel: positive bilaterally Phalen's test: positive bilaterally   TREATMENT:    Manual therapy: to reduce pain and tissue tension, improve range of motion, neuromodulation, in order to promote improved ability to complete functional activities. SUPINE with lower legs supported - STM to posterior cervical spine and upper thoracic spine musculature and B SCM - CPA at upper thoracic spine through mid cervical spine grade  II-III - gentle manual cervical spine distraction   Therapeutic exercise: to centralize symptoms and improve ROM, strength, muscular endurance, and activity tolerance required for successful completion of functional activities.  - CTS tests (see above) - supine chin tuck, 5 second hold, 1x20 - Sidelying open book (thoracic rotation) to improve thoracic, shoulder girdle, and upper trunk mobility. Required instruction for technique and cuing to achieve end range as tolerated, hold time, and breathing technique. 5 second holds. Left side 1x20, Right side 1x10 (due to time limitations).  - seated scapular retractions, 1x5 - demonstration of tendon glides (to be reviewed further at next session).  - Education on HEP including handout    Pt required multimodal cuing for proper technique and to facilitate improved neuromuscular control, strength, range of motion, and functional ability resulting in improved performance and form.   HOME EXERCISE PROGRAM Verbally given: median nerve glides, slider technique, 1x10, 3 times a day Access Code: [email or web address] URL: [email or web address] Date: 06/16/2021 Prepared by: Norton Blizzard  Exercises Supine Chin Tuck - 1-2 x daily - 1 sets - 20 reps - 5 seconds hold Sidelying Thoracic Rotation with Open Book - 1-2 x daily - 1 sets - 20 reps - 5 seconds hold Seated Scapular Retraction - 1-2 x daily - 1 sets - 20 reps - 5 seconds hold Wrist Tendon Gliding - 3 x daily - 1 sets - 10 reps     PT Education - 06/16/21 1234     Education Details exercise/intevention purpose/form. anatomy and physiology. CTS vs cervical radiculopathy. rationale/results of exam.    Person(s) Educated Patient    Methods Explanation;Demonstration;Tactile cues;Verbal cues;Handout    Comprehension Verbalized understanding;Returned demonstration;Verbal cues required;Tactile cues required;Need further instruction              PT Short Term Goals - 06/16/21 1244       PT  SHORT TERM GOAL #1   Title Be independent with initial home exercise program for self-management of symptoms.    Baseline Initial HEP to be provided at visit 2 as appropriate (06/10/2021);    Time 2    Period Weeks    Status Achieved    Target Date 06/25/21               PT Long Term Goals - 06/11/21 0949       PT LONG TERM GOAL #1   Title Be independent with a long-term home exercise program for self-management of symptoms.    Baseline Initial HEP to be provided at visit 2 as appropriate (06/10/2021);    Time 12    Period Weeks    Status New   TARGET DATE FOR ALL LONG TERM GOALS: 09/02/2021     PT LONG TERM GOAL #2   Title Demonstrate improved FOTO score by 10 units to demonstrate improvement in overall condition and self-reported functional ability.    Baseline to be measured visit 2 as appropriate (06/10/2021);  Time 12    Period Weeks    Status New      PT LONG TERM GOAL #3   Title Have full bilateral shoulder AROM with no compensations or increase in pain in all planes except intermittent end range discomfort to allow patient to complete valued activities such as reaching with less difficulty.    Baseline overhead ROM limited - see objective (06/11/2021);    Time 12    Period Weeks    Status New      PT LONG TERM GOAL #4   Title Improve B UE strength to equal or greater than 4+/5 with no increase in pain to improve patient's ability to complete  valued functional tasks working, lifting, carrying, reaching with less difficulty.    Baseline Ranges from 3/5 to 4+/5 and increases pain - see objective (06/10/2021);    Time 12    Period Weeks    Status New      PT LONG TERM GOAL #5   Title Complete community, work and/or recreational activities such as those that require use of B UE including ADLs, working, reaching, lifting, carrying, sleeping, sitting without limitation due to current condition.    Baseline currently reported as funcitonal limitations (06/10/2021);    Time 12     Period Weeks    Status New      Additional Long Term Goals   Additional Long Term Goals Yes      PT LONG TERM GOAL #6   Title Reduce pain with functional activities to equal or less than 3/10 to allow patient to complete usual activities including ADLs, IADLs, work, and social engagement with less difficulty.    Baseline worst 8/10 (06/10/2021);    Time 12    Period Weeks    Status New                   Plan - 06/16/21 1242     Clinical Impression Statement Patient here with in-person Spanish interpreter who assisted with communication throughout session. She reported relief from some symptoms at the neck and arms with manual therapy. She reports belief that manual interventions will help her the most.  She was able to perform exercises with adequate form following cuing and was educated in importance of not overstretching. Did report more discomfort with R sided open book than left. Patient educated on cervical radiculopathy, CTS, and nerve function. Her special tests are consistent with CTS. Patient also appears to have cervical radiculopathy contributing to symptoms and would benefit from continuing exercise, manual therapy, and education to address her remaining impairments and functional limitations. Patient would benefit from continued management of limiting condition by skilled physical therapist to address remaining impairments and functional limitations to work towards stated goals and return to PLOF or maximal functional independence.    Personal Factors and Comorbidities Comorbidity 3+;Social Background;Profession;Past/Current Experience;Time since onset of injury/illness/exacerbation;Fitness;Age    Comorbidities ARTHROSCOPIC ROTATOR CUFF REPAIR Right 10/19/11, hyperlipidemia, overweight, severe B CTS, hx of low back pain with radiculopathy    Examination-Activity Limitations Lift;Hygiene/Grooming;Caring for Others;Reach Overhead;Carry;Sleep    Examination-Participation  Restrictions Community Activity;Occupation;Cleaning;Interpersonal Relationship;Other    Stability/Clinical Decision Making Evolving/Moderate complexity    Rehab Potential Fair    PT Duration 12 weeks    PT Treatment/Interventions ADLs/Self Care Home Management;Aquatic Therapy;Cryotherapy;Moist Heat;Electrical Stimulation;Therapeutic activities;Therapeutic exercise;Neuromuscular re-education;Manual techniques;Dry needling;Spinal Manipulations;Joint Manipulations;Passive range of motion    PT Next Visit Plan update HEP as appropriate, manual therapy as needed, postural strengthning    PT  Home Exercise Plan Access Code: [email or web address]; Verbally given: median nerve glides, slider technique, 1x10, 3 times a day    Consulted and Agree with Plan of Care Patient             Patient will benefit from skilled therapeutic intervention in order to improve the following deficits and impairments:  Improper body mechanics, Pain, Postural dysfunction, Decreased activity tolerance, Decreased endurance, Decreased range of motion, Decreased strength, Hypomobility, Impaired perceived functional ability, Impaired UE functional use, Impaired sensation  Visit Diagnosis: Cervicalgia  Radiculopathy, cervical region  Muscle weakness (generalized)  Pain in right elbow     Problem List There are no problems to display for this patient.   Luretha MurphySara R. Ilsa IhaSnyder, PT, DPT 06/16/21, 12:44 PM  [facility] [address] , [place], [id number] Phone: [phone]   Fax:  [phone]  Name: Dede QueryGumercinda Go MRN: [id number] Date of Birth: 01-Mar-1961

## 2021-06-16 NOTE — Therapy (Signed)
[facility] [address], [place], [address] Phone: [phone]   Fax:  [phone]  Physical Therapy Treatment  Patient Details  Name: Jessica Holden MRN: [id number] Date of Birth: [birth year] Referring Provider (PT): Filomena Jungling, MD   Encounter Date: 06/16/2021    Past Medical History:  Diagnosis Date   Hyperlipidemia     Past Surgical History:  Procedure Laterality Date   ARTHROSCOPIC ROTATOR CUFF REPAIR Right 10/19/2011    There were no vitals filed for this visit.                                 PT Short Term Goals - 06/11/21 0947       PT SHORT TERM GOAL #1   Title Be independent with initial home exercise program for self-management of symptoms.    Baseline Initial HEP to be provided at visit 2 as appropriate (06/10/2021);    Time 2    Period Weeks    Status New    Target Date 06/25/21               PT Long Term Goals - 06/11/21 0949       PT LONG TERM GOAL #1   Title Be independent with a long-term home exercise program for self-management of symptoms.    Baseline Initial HEP to be provided at visit 2 as appropriate (06/10/2021);    Time 12    Period Weeks    Status New   TARGET DATE FOR ALL LONG TERM GOALS: 09/02/2021     PT LONG TERM GOAL #2   Title Demonstrate improved FOTO score by 10 units to demonstrate improvement in overall condition and self-reported functional ability.    Baseline to be measured visit 2 as appropriate (06/10/2021);    Time 12    Period Weeks    Status New      PT LONG TERM GOAL #3   Title Have full bilateral shoulder AROM with no compensations or increase in pain in all planes except intermittent end range discomfort to allow patient to complete valued activities such as reaching with less difficulty.    Baseline overhead ROM limited - see objective (06/11/2021);    Time 12    Period Weeks    Status New      PT  LONG TERM GOAL #4   Title Improve B UE strength to equal or greater than 4+/5 with no increase in pain to improve patient's ability to complete  valued functional tasks working, lifting, carrying, reaching with less difficulty.    Baseline Ranges from 3/5 to 4+/5 and increases pain - see objective (06/10/2021);    Time 12    Period Weeks    Status New      PT LONG TERM GOAL #5   Title Complete community, work and/or recreational activities such as those that require use of B UE including ADLs, working, reaching, lifting, carrying, sleeping, sitting without limitation due to current condition.    Baseline currently reported as funcitonal limitations (06/10/2021);    Time 12    Period Weeks    Status New      Additional Long Term Goals   Additional Long Term Goals Yes      PT LONG TERM GOAL #6   Title Reduce pain with functional activities to equal or less than 3/10 to allow patient to complete usual activities  including ADLs, IADLs, work, and social engagement with less difficulty.    Baseline worst 8/10 (06/10/2021);    Time 12    Period Weeks    Status New                    Patient will benefit from skilled therapeutic intervention in order to improve the following deficits and impairments:     Visit Diagnosis: No diagnosis found.     Problem List There are no problems to display for this patient.   Cira Rue, PT 06/16/2021, 9:40 AM  [facility] PHYSICAL AND SPORTS MEDICINE [address] [place], [address] Phone: [phone]   Fax:  [phone]  Name: Jessica Holden MRN: [id number] Date of Birth: October 18, 1960

## 2021-06-19 ENCOUNTER — Encounter: Payer: Self-pay | Admitting: Physical Therapy

## 2021-06-19 ENCOUNTER — Ambulatory Visit: Payer: BLUE CROSS/BLUE SHIELD | Admitting: Physical Therapy

## 2021-06-19 DIAGNOSIS — M542 Cervicalgia: Secondary | ICD-10-CM

## 2021-06-19 DIAGNOSIS — M25521 Pain in right elbow: Secondary | ICD-10-CM

## 2021-06-19 DIAGNOSIS — M5412 Radiculopathy, cervical region: Secondary | ICD-10-CM

## 2021-06-19 DIAGNOSIS — M6281 Muscle weakness (generalized): Secondary | ICD-10-CM

## 2021-06-19 NOTE — Therapy (Signed)
[facility] [address], [place], [address] Phone: [phone]   Fax:  [phone]  Physical Therapy Treatment  Patient Details  Name: Jessica Holden MRN: [id number] Date of Birth: 01-25-1961 Referring Provider (PT): Filomena Jungling, MD   Encounter Date: 06/19/2021   PT End of Session - 06/19/21 0953     Visit Number 4    Number of Visits 24    Date for PT Re-Evaluation 09/02/21    Authorization Type [employer] reporting period from 06/10/2021    Authorization - Visit Number 4    Authorization - Number of Visits 30    Progress Note Due on Visit 10    PT Start Time 0900    PT Stop Time 0943    PT Time Calculation (min) 43 min    Activity Tolerance Patient tolerated treatment well    Behavior During Therapy [facility] for tasks assessed/performed             Past Medical History:  Diagnosis Date   Hyperlipidemia     Past Surgical History:  Procedure Laterality Date   ARTHROSCOPIC ROTATOR CUFF REPAIR Right 10/19/2011    There were no vitals filed for this visit.   Subjective Assessment - 06/19/21 0903     Subjective Utilized Spanish interpreter through video on ipad/stick who assisted with communication throughout session. Pateint reports she is feeling better since last PT session and states the massage helped. She states she continues to have pain in her right elbow. She states it feels like "her nerves are bundled up" or her "muscle is being sucked in" when she tries to stretch out her elbow. Points to pain at the distal triceps and olecranon process. States her worst pain is at her elbow still, maybe 5-6/10. She has been taking a pill and she thinks it has been helping her a lot. She took it yesterday and the day before. She also feels like some tea she is taking at home helped. Unsure what pills but they were prescribed by her referring MD but she previously had to stop due to GI upset.  It has been helping her right leg as well. Continues to have nunbess in the left UE and pain in the dorsal L wrist and at the scapula.    Patient is accompained by: Interpreter    Pertinent History Patient is a 60 y.o. female who presents to outpatient physical therapy with a referral for medical diagnosis cervicalgia and cervical radiculopathy. This patient's chief complaints consist of constant lower neck pain and B UE paresthesia with intermittent headaches and pain to B hands and down back. Also complains of intermittent R sided LE numbness and R elbow pain and feeling it is "dislocated." Upper quarter, neck, head complaints lead to the following functional deficits: Difficulty with activities that require use of B UE including ADLs, working, reaching, lifting, carrying, sleeping, sitting. Relevant past medical history and comorbidities include ARTHROSCOPIC ROTATOR CUFF REPAIR Right 10/19/11, hyperlipidemia, overweight, severe B CTS, hx of low back pain with radicular symptoms.   Patient denies hx of cancer, stroke, spinal surgeries, lung problems, HTN, heart problems, diabetes, unexplained weight loss. Doe note some urinary incontinence and feeling of something in her vagina that has not changed with neck symptoms.    Limitations Sitting;Lifting;House hold activities;Other (comment)   Functional Limitations: Difficulty with activities that require use of B UE including ADLs, working, reaching, lifting, carrying, sleeping, sitting.   Diagnostic tests  States she had a nerve conduction study at the wrists but she stopped them from doing the neck. Results not available to PT at this time.    Patient Stated Goals would like to improve her problems in her neck and arms by the time she needs to go back to work.    Currently in Pain? Yes    Pain Score 5     Pain Location Elbow    Pain Orientation Right    Pain Onset More than a month ago             TREATMENT:  Denies latex sensitivity   Manual  therapy: to reduce pain and tissue tension, improve range of motion, neuromodulation, in order to promote improved ability to complete functional activities. SUPINE with lower legs supported - STM to posterior cervical spine and upper thoracic spine musculature and B SCM - CPA at upper thoracic spine through mid cervical spine grade II-III - gentle manual cervical spine distraction    Therapeutic exercise: to centralize symptoms and improve ROM, strength, muscular endurance, and activity tolerance required for successful completion of functional activities.  - supine chin tuck, 5 second hold, 1x10 - Sidelying open book (thoracic rotation) to improve thoracic, shoulder girdle, and upper trunk mobility. Required instruction for technique and cuing to achieve end range as tolerated, hold time, and breathing technique. 5 second holds. 1x5 each side.  - seated scapular retractions, 1x5 - standing scapular row with RTB with loops around wrists to improve posture while avoiding irritating CTS. 1x20 - Education on HEP    Pt required multimodal cuing for proper technique and to facilitate improved neuromuscular control, strength, range of motion, and functional ability resulting in improved performance and form.   HOME EXERCISE PROGRAM Verbally given: median nerve glides, slider technique, 1x10, 3 times a day Access Code: [email or web address] URL: [email or web address] Date: 06/19/2021 Prepared by: Norton Blizzard  Exercises Supine Chin Tuck - 1-2 x daily - 1 sets - 20 reps - 5 seconds hold Sidelying Thoracic Rotation with Open Book - 1-2 x daily - 1 sets - 20 reps - 5 seconds hold Seated Scapular Retraction - 1-2 x daily - 1 sets - 20 reps - 5 seconds hold Wrist Tendon Gliding - 3 x daily - 1 sets - 10 reps Row with band/cable - 1 x daily - 3 sets - 10 reps     PT Education - 06/19/21 0950     Education Details exercise/intervention purpose/form.    Person(s) Educated Patient    Methods  Explanation;Demonstration;Verbal cues;Tactile cues    Comprehension Verbalized understanding;Returned demonstration;Verbal cues required;Tactile cues required;Need further instruction              PT Short Term Goals - 06/16/21 1244       PT SHORT TERM GOAL #1   Title Be independent with initial home exercise program for self-management of symptoms.    Baseline Initial HEP to be provided at visit 2 as appropriate (06/10/2021);    Time 2    Period Weeks    Status Achieved    Target Date 06/25/21               PT Long Term Goals - 06/11/21 0949       PT LONG TERM GOAL #1   Title Be independent with a long-term home exercise program for self-management of symptoms.    Baseline Initial HEP to be provided at visit 2 as appropriate (06/10/2021);    Time 12  Period Weeks    Status New   TARGET DATE FOR ALL LONG TERM GOALS: 09/02/2021     PT LONG TERM GOAL #2   Title Demonstrate improved FOTO score by 10 units to demonstrate improvement in overall condition and self-reported functional ability.    Baseline to be measured visit 2 as appropriate (06/10/2021);    Time 12    Period Weeks    Status New      PT LONG TERM GOAL #3   Title Have full bilateral shoulder AROM with no compensations or increase in pain in all planes except intermittent end range discomfort to allow patient to complete valued activities such as reaching with less difficulty.    Baseline overhead ROM limited - see objective (06/11/2021);    Time 12    Period Weeks    Status New      PT LONG TERM GOAL #4   Title Improve B UE strength to equal or greater than 4+/5 with no increase in pain to improve patient's ability to complete  valued functional tasks working, lifting, carrying, reaching with less difficulty.    Baseline Ranges from 3/5 to 4+/5 and increases pain - see objective (06/10/2021);    Time 12    Period Weeks    Status New      PT LONG TERM GOAL #5   Title Complete community, work and/or  recreational activities such as those that require use of B UE including ADLs, working, reaching, lifting, carrying, sleeping, sitting without limitation due to current condition.    Baseline currently reported as funcitonal limitations (06/10/2021);    Time 12    Period Weeks    Status New      Additional Long Term Goals   Additional Long Term Goals Yes      PT LONG TERM GOAL #6   Title Reduce pain with functional activities to equal or less than 3/10 to allow patient to complete usual activities including ADLs, IADLs, work, and social engagement with less difficulty.    Baseline worst 8/10 (06/10/2021);    Time 12    Period Weeks    Status New                   Plan - 06/19/21 0958     Clinical Impression Statement Utilized Spanish interpreter on video through the ipad/stick who assisted with communication throughout session. Pateint tolerated treatment well and appears to be responding well to manual therapy interventions and tolerating exercises adequately. Was able to progress to resisted scapular rows. Extra time was spent screening patient for abuse and advance directive. Required extended time to clarify response and educate patient about what an advance directive is. Patient continues to be limited by pain and paresthesia at the neck and B UE. Patient would benefit from continued management of limiting condition by skilled physical therapist to address remaining impairments and functional limitations to work towards stated goals and return to PLOF or maximal functional independence.    Personal Factors and Comorbidities Comorbidity 3+;Social Background;Profession;Past/Current Experience;Time since onset of injury/illness/exacerbation;Fitness;Age    Comorbidities ARTHROSCOPIC ROTATOR CUFF REPAIR Right 10/19/11, hyperlipidemia, overweight, severe B CTS, hx of low back pain with radiculopathy    Examination-Activity Limitations Lift;Hygiene/Grooming;Caring for Others;Reach  Overhead;Carry;Sleep    Examination-Participation Restrictions Community Activity;Occupation;Cleaning;Interpersonal Relationship;Other    Stability/Clinical Decision Making Evolving/Moderate complexity    Rehab Potential Fair    PT Duration 12 weeks    PT Treatment/Interventions ADLs/Self Care Home Management;Aquatic Therapy;Cryotherapy;Moist Heat;Electrical Stimulation;Therapeutic activities;Therapeutic  exercise;Neuromuscular re-education;Manual techniques;Dry needling;Spinal Manipulations;Joint Manipulations;Passive range of motion    PT Next Visit Plan update HEP as appropriate, manual therapy as needed, postural strengthning    PT Home Exercise Plan Access Code: [email or web address]; Verbally given: median nerve glides, slider technique, 1x10, 3 times a day    Consulted and Agree with Plan of Care Patient             Patient will benefit from skilled therapeutic intervention in order to improve the following deficits and impairments:  Improper body mechanics, Pain, Postural dysfunction, Decreased activity tolerance, Decreased endurance, Decreased range of motion, Decreased strength, Hypomobility, Impaired perceived functional ability, Impaired UE functional use, Impaired sensation  Visit Diagnosis: Cervicalgia  Radiculopathy, cervical region  Muscle weakness (generalized)  Pain in right elbow     Problem List There are no problems to display for this patient.   Luretha Murphy. Ilsa Iha, PT, DPT 06/19/21, 10:08 AM   [facility] PHYSICAL AND SPORTS MEDICINE [address], [place], [address] Phone: [phone]   Fax:  [phone]  Name: Shunte Senseney MRN: [id number] Date of Birth: [birth year]

## 2021-06-23 ENCOUNTER — Other Ambulatory Visit: Payer: Self-pay

## 2021-06-23 ENCOUNTER — Ambulatory Visit: Payer: BLUE CROSS/BLUE SHIELD | Admitting: Physical Therapy

## 2021-06-23 DIAGNOSIS — M542 Cervicalgia: Secondary | ICD-10-CM

## 2021-06-23 DIAGNOSIS — M25521 Pain in right elbow: Secondary | ICD-10-CM

## 2021-06-23 DIAGNOSIS — M6281 Muscle weakness (generalized): Secondary | ICD-10-CM

## 2021-06-23 DIAGNOSIS — M5412 Radiculopathy, cervical region: Secondary | ICD-10-CM

## 2021-06-23 NOTE — Therapy (Signed)
[facility] [address], [place], [address] Phone: [phone]   Fax:  [phone]  Physical Therapy Treatment  Patient Details  Name: Aunya Lemler MRN: [id number] Date of Birth: Jul 05, 1961 Referring Provider (PT): Filomena Jungling, MD   Encounter Date: 06/23/2021   PT End of Session - 06/24/21 0953     Visit Number 5    Number of Visits 24    Date for PT Re-Evaluation 09/02/21    Authorization Type [employer] reporting period from 06/10/2021    Authorization - Visit Number 5    Authorization - Number of Visits 30    Progress Note Due on Visit 10    PT Start Time 1823    PT Stop Time 1905    PT Time Calculation (min) 42 min    Activity Tolerance Patient tolerated treatment well    Behavior During Therapy [facility] for tasks assessed/performed             Past Medical History:  Diagnosis Date   Hyperlipidemia     Past Surgical History:  Procedure Laterality Date   ARTHROSCOPIC ROTATOR CUFF REPAIR Right 10/19/2011    There were no vitals filed for this visit.   Subjective Assessment - 06/24/21 0857     Subjective Utilized in person Spanish interpreter who assisted with communication throughout session. Patient reports 6/10 right hand pain today. She states that she has not taken an medication or used any ointment to manage her pain. Patient also reports a discomfort in her back when doing her theraband exercises. She states that she notices the discomfort when sitting as well.    Patient is accompained by: Interpreter    Pertinent History Patient is a 60 y.o. female who presents to outpatient physical therapy with a referral for medical diagnosis cervicalgia and cervical radiculopathy. This patient's chief complaints consist of constant lower neck pain and B UE paresthesia with intermittent headaches and pain to B hands and down back. Also complains of intermittent R sided LE numbness  and R elbow pain and feeling it is "dislocated." Upper quarter, neck, head complaints lead to the following functional deficits: Difficulty with activities that require use of B UE including ADLs, working, reaching, lifting, carrying, sleeping, sitting. Relevant past medical history and comorbidities include ARTHROSCOPIC ROTATOR CUFF REPAIR Right 10/19/11, hyperlipidemia, overweight, severe B CTS, hx of low back pain with radicular symptoms.   Patient denies hx of cancer, stroke, spinal surgeries, lung problems, HTN, heart problems, diabetes, unexplained weight loss. Doe note some urinary incontinence and feeling of something in her vagina that has not changed with neck symptoms.    Limitations Sitting;Lifting;House hold activities;Other (comment)   Functional Limitations: Difficulty with activities that require use of B UE including ADLs, working, reaching, lifting, carrying, sleeping, sitting.   Diagnostic tests States she had a nerve conduction study at the wrists but she stopped them from doing the neck. Results not available to PT at this time.    Patient Stated Goals would like to improve her problems in her neck and arms by the time she needs to go back to work.    Currently in Pain? Yes    Pain Score 6     Pain Location Hand    Pain Orientation Right    Pain Onset More than a month ago                 TREATMENT:  Manual therapy: to reduce  pain and tissue tension, improve range of motion, neuromodulation, in order to promote improved ability to complete functional activities. SUPINE with lower legs supported - STM to posterior cervical spine and upper thoracic spine musculature and B SCM - CPA at upper thoracic spine through mid cervical spine grade II-III - gentle manual cervical spine distraction    Therapeutic exercise: to centralize symptoms and improve ROM, strength, muscular endurance, and activity tolerance required for successful completion of functional activities.  -  seated chin tuck, 1x20  - Sidelying open book (thoracic rotation) to improve thoracic, shoulder girdle, and upper trunk mobility. Required instruction for technique and cuing to achieve end range as tolerated and hold time. 1x15 each side.   - wrist tendon gliding (DIP, PIP, MCP) 1x10 each direction. Performed on both hands. Patient required heavy tactile and verbal cuing to recall exercise sequence and perform exercise currently.  - seated median nerve glides 1x10 each side.  - seated ulnar nerve glides 1x10 each side.  - Education on HEP    Pt required multimodal cuing for proper technique and to facilitate improved neuromuscular control, strength, range of motion, and functional ability resulting in improved performance and form.   HOME EXERCISE PROGRAM Verbally given: median nerve glides, slider technique, 1x10, 3 times a day Access Code: [email or web address] URL: [email or web address] Date: 06/19/2021 Prepared by: Norton Blizzard   Exercises Supine Chin Tuck - 1-2 x daily - 1 sets - 20 reps - 5 seconds hold Sidelying Thoracic Rotation with Open Book - 1-2 x daily - 1 sets - 20 reps - 5 seconds hold Seated Scapular Retraction - 1-2 x daily - 1 sets - 20 reps - 5 seconds hold Wrist Tendon Gliding - 3 x daily - 1 sets - 10 reps Row with band/cable - 1 x daily - 3 sets - 10 reps       PT Education - 06/24/21 0953     Education Details exercise/intervention purpose/form.    Person(s) Educated Patient    Methods Explanation;Demonstration;Tactile cues;Verbal cues    Comprehension Verbalized understanding;Returned demonstration;Tactile cues required;Verbal cues required              PT Short Term Goals - 06/16/21 1244       PT SHORT TERM GOAL #1   Title Be independent with initial home exercise program for self-management of symptoms.    Baseline Initial HEP to be provided at visit 2 as appropriate (06/10/2021);    Time 2    Period Weeks    Status Achieved    Target Date  06/25/21               PT Long Term Goals - 06/11/21 0949       PT LONG TERM GOAL #1   Title Be independent with a long-term home exercise program for self-management of symptoms.    Baseline Initial HEP to be provided at visit 2 as appropriate (06/10/2021);    Time 12    Period Weeks    Status New   TARGET DATE FOR ALL LONG TERM GOALS: 09/02/2021     PT LONG TERM GOAL #2   Title Demonstrate improved FOTO score by 10 units to demonstrate improvement in overall condition and self-reported functional ability.    Baseline to be measured visit 2 as appropriate (06/10/2021);    Time 12    Period Weeks    Status New      PT LONG TERM GOAL #3   Title Have full bilateral shoulder  AROM with no compensations or increase in pain in all planes except intermittent end range discomfort to allow patient to complete valued activities such as reaching with less difficulty.    Baseline overhead ROM limited - see objective (06/11/2021);    Time 12    Period Weeks    Status New      PT LONG TERM GOAL #4   Title Improve B UE strength to equal or greater than 4+/5 with no increase in pain to improve patient's ability to complete  valued functional tasks working, lifting, carrying, reaching with less difficulty.    Baseline Ranges from 3/5 to 4+/5 and increases pain - see objective (06/10/2021);    Time 12    Period Weeks    Status New      PT LONG TERM GOAL #5   Title Complete community, work and/or recreational activities such as those that require use of B UE including ADLs, working, reaching, lifting, carrying, sleeping, sitting without limitation due to current condition.    Baseline currently reported as funcitonal limitations (06/10/2021);    Time 12    Period Weeks    Status New      Additional Long Term Goals   Additional Long Term Goals Yes      PT LONG TERM GOAL #6   Title Reduce pain with functional activities to equal or less than 3/10 to allow patient to complete usual activities  including ADLs, IADLs, work, and social engagement with less difficulty.    Baseline worst 8/10 (06/10/2021);    Time 12    Period Weeks    Status New                   Plan - 06/24/21 1011     Clinical Impression Statement Utilized in person Spanish interpreter who assisted with communication throughout the session. Patient tolerated treatment well. However, patient pain continues to interfere with patient's usual function and quality of life Patient demonstrated poor carry over of wrist tendon gliding to manage patient symptoms and required heavy cuing to perform exercise during today's session. Patient will continue to benefit from skilled physical therapy intervention to address impairments and maximize functional ability.    Personal Factors and Comorbidities Comorbidity 3+;Social Background;Profession;Past/Current Experience;Time since onset of injury/illness/exacerbation;Fitness;Age    Comorbidities ARTHROSCOPIC ROTATOR CUFF REPAIR Right 10/19/11, hyperlipidemia, overweight, severe B CTS, hx of low back pain with radiculopathy    Examination-Activity Limitations Lift;Hygiene/Grooming;Caring for Others;Reach Overhead;Carry;Sleep    Examination-Participation Restrictions Community Activity;Occupation;Cleaning;Interpersonal Relationship;Other    Stability/Clinical Decision Making Evolving/Moderate complexity    Rehab Potential Fair    PT Duration 12 weeks    PT Treatment/Interventions ADLs/Self Care Home Management;Aquatic Therapy;Cryotherapy;Moist Heat;Electrical Stimulation;Therapeutic activities;Therapeutic exercise;Neuromuscular re-education;Manual techniques;Dry needling;Spinal Manipulations;Joint Manipulations;Passive range of motion    PT Next Visit Plan update HEP as appropriate, manual therapy as needed, postural strengthning    PT Home Exercise Plan Access Code: [email or web address]; Verbally given: median nerve glides, slider technique, 1x10, 3 times a day    Consulted and Agree with  Plan of Care Patient             Patient will benefit from skilled therapeutic intervention in order to improve the following deficits and impairments:  Improper body mechanics, Pain, Postural dysfunction, Decreased activity tolerance, Decreased endurance, Decreased range of motion, Decreased strength, Hypomobility, Impaired perceived functional ability, Impaired UE functional use, Impaired sensation  Visit Diagnosis: Cervicalgia  Radiculopathy, cervical region  Muscle weakness (generalized)  Pain in right elbow  Problem List There are no problems to display for this patient.  Vanessa Barbara, SPT  Student physical therapist under direct supervision of licensed physical therapists during the entirety of the session.   Luretha Murphy. Ilsa Iha, PT, DPT 06/24/21, 10:34 AM   [facility] PHYSICAL AND SPORTS MEDICINE [address] [place], [address] Phone: [phone]   Fax:  [phone]  Name: Serra Younan MRN: [id number] Date of Birth: 05-12-1961

## 2021-06-24 ENCOUNTER — Encounter: Payer: Self-pay | Admitting: Physical Therapy

## 2021-06-25 ENCOUNTER — Ambulatory Visit: Payer: BLUE CROSS/BLUE SHIELD | Admitting: Physical Therapy

## 2021-06-30 ENCOUNTER — Ambulatory Visit: Payer: BLUE CROSS/BLUE SHIELD | Admitting: Physical Therapy

## 2021-07-03 ENCOUNTER — Ambulatory Visit: Payer: BLUE CROSS/BLUE SHIELD | Admitting: Physical Therapy

## 2021-07-03 ENCOUNTER — Encounter: Payer: Self-pay | Admitting: Physical Therapy

## 2021-07-03 DIAGNOSIS — M5412 Radiculopathy, cervical region: Secondary | ICD-10-CM

## 2021-07-03 DIAGNOSIS — M6281 Muscle weakness (generalized): Secondary | ICD-10-CM

## 2021-07-03 DIAGNOSIS — M25521 Pain in right elbow: Secondary | ICD-10-CM

## 2021-07-03 DIAGNOSIS — M542 Cervicalgia: Secondary | ICD-10-CM | POA: Diagnosis not present

## 2021-07-03 NOTE — Therapy (Signed)
[facility] [address], [place], [address] Phone: [phone]   Fax:  [phone]  Physical Therapy Treatment  Patient Details  Name: Jessica Holden MRN: [id number] Date of Birth: Feb 11, 1961 Referring Provider (PT): Filomena Jungling, MD   Encounter Date: 07/03/2021   PT End of Session - 07/03/21 1051     Visit Number 6    Number of Visits 24    Date for PT Re-Evaluation 09/02/21    Authorization Type [employer] reporting period from 06/10/2021    Authorization - Visit Number 6    Authorization - Number of Visits 30    Progress Note Due on Visit 10    PT Start Time 0945    PT Stop Time 1035    PT Time Calculation (min) 50 min    Activity Tolerance Patient tolerated treatment well    Behavior During Therapy [facility] for tasks assessed/performed             Past Medical History:  Diagnosis Date   Hyperlipidemia     Past Surgical History:  Procedure Laterality Date   ARTHROSCOPIC ROTATOR CUFF REPAIR Right 10/19/2011    There were no vitals filed for this visit.   Subjective Assessment - 07/03/21 0947     Subjective Utilized in person Spanish interpreter who assisted with communication throughout session. Patient reports she is feeling good today. States she has been doing her exercises as presciribed. States her R elbow is not better but her neck and arms otherwise have gotten a bit better. She has been doing her exercises except she did not have much energy to do them while she was sick. She states she feels a little weak but she is good. Current pain: right elbow 6/10  or below over her left biceps and down to wrist by thumb. the rest of her pain in neck and amrs is the same, not a high level, and she has been taking medication and that has been helping.    Patient is accompained by: Interpreter    Pertinent History Patient is a 60 y.o. female who presents to outpatient physical therapy  with a referral for medical diagnosis cervicalgia and cervical radiculopathy. This patient's chief complaints consist of constant lower neck pain and B UE paresthesia with intermittent headaches and pain to B hands and down back. Also complains of intermittent R sided LE numbness and R elbow pain and feeling it is "dislocated." Upper quarter, neck, head complaints lead to the following functional deficits: Difficulty with activities that require use of B UE including ADLs, working, reaching, lifting, carrying, sleeping, sitting. Relevant past medical history and comorbidities include ARTHROSCOPIC ROTATOR CUFF REPAIR Right 10/19/11, hyperlipidemia, overweight, severe B CTS, hx of low back pain with radicular symptoms.   Patient denies hx of cancer, stroke, spinal surgeries, lung problems, HTN, heart problems, diabetes, unexplained weight loss. Doe note some urinary incontinence and feeling of something in her vagina that has not changed with neck symptoms.    Limitations Sitting;Lifting;House hold activities;Other (comment)    Diagnostic tests States she had a nerve conduction study at the wrists but she stopped them from doing the neck. Results not available to PT at this time.    Patient Stated Goals would like to improve her problems in her neck and arms by the time she needs to go back to work.    Currently in Pain? Yes    Pain Score 6  TREATMENT:  Manual therapy: to reduce pain and tissue tension, improve range of motion, neuromodulation, in order to promote improved ability to complete functional activities. PRONE - CPA grade III-IV to upper thoracic and cervical spine segments, concentrating on painful segments in mid cervical spine that reproduces arm pain bilaterally.  - R and L UPA grade III at mid cervical spine where patients arm/hand symptoms are reproduced (near thumb and 2nd and 3rd digits).  - STM to bilateral posterior cervical spine and thoracic  musculature  including UT, LS, paraspinals to decrease pain and tension. Especially painful and tight at mid cervical spine paraspinals.  HOOKLYING - STM to posterior cervical spine focusing on suboccipital region - gentle manual cervical spine distraction    Therapeutic exercise: to centralize symptoms and improve ROM, strength, muscular endurance, and activity tolerance required for successful completion of functional activities.  - hooklying chin tuck for deep neck flexor training 1x20 with 5 second hold.  - hooklying chin tuck to lift, 2x10, with 2 second hold  PRONE - scapular retraction with hands by sides with lift off, 1x10 plus more reps to learn technique - prone scapular retraction W. 1x10. (Reports pulling over left thumb extensors, educated how to relax hand for improved comfort).    - Education on HEP including handout   Pt required multimodal cuing for proper technique and to facilitate improved neuromuscular control, strength, range of motion, and functional ability resulting in improved performance and form.   HOME EXERCISE PROGRAM Access Code: [email or web address] URL: [email or web address] Date: 07/03/2021 Prepared by: Norton Blizzard  Exercises Supine Chin Tuck - 1 x daily - 1 sets - 20 reps - 5 seconds hold Supine Deep Neck Flexor Training - Repetitions - 2 sets - 10 reps - 2 seconds hold Prone Scapular Slide with Shoulder Extension - 2 sets - 10 reps - 2-5 seconds hold Prone W Scapular Retraction - 2 sets - 10 reps - 2-5 seconds hold Sidelying Thoracic Rotation with Open Book - 1-2 x daily - 1 sets - 20 reps - 5 seconds hold Wrist Tendon Gliding - 3 x daily - 1 sets - 10 reps Row with band/cable - 1 x daily - 3 sets - 10 reps   PT Education - 07/03/21 1049     Education Details exercise/intervention purpose/form, POC.    Person(s) Educated Patient    Methods Explanation;Demonstration;Tactile cues;Verbal cues;Handout    Comprehension Verbalized understanding;Returned  demonstration;Verbal cues required;Tactile cues required;Need further instruction              PT Short Term Goals - 06/16/21 1244       PT SHORT TERM GOAL #1   Title Be independent with initial home exercise program for self-management of symptoms.    Baseline Initial HEP to be provided at visit 2 as appropriate (06/10/2021);    Time 2    Period Weeks    Status Achieved    Target Date 06/25/21               PT Long Term Goals - 06/11/21 0949       PT LONG TERM GOAL #1   Title Be independent with a long-term home exercise program for self-management of symptoms.    Baseline Initial HEP to be provided at visit 2 as appropriate (06/10/2021);    Time 12    Period Weeks    Status New   TARGET DATE FOR ALL LONG TERM GOALS: 09/02/2021     PT LONG TERM GOAL #2  Title Demonstrate improved FOTO score by 10 units to demonstrate improvement in overall condition and self-reported functional ability.    Baseline to be measured visit 2 as appropriate (06/10/2021);    Time 12    Period Weeks    Status New      PT LONG TERM GOAL #3   Title Have full bilateral shoulder AROM with no compensations or increase in pain in all planes except intermittent end range discomfort to allow patient to complete valued activities such as reaching with less difficulty.    Baseline overhead ROM limited - see objective (06/11/2021);    Time 12    Period Weeks    Status New      PT LONG TERM GOAL #4   Title Improve B UE strength to equal or greater than 4+/5 with no increase in pain to improve patient's ability to complete  valued functional tasks working, lifting, carrying, reaching with less difficulty.    Baseline Ranges from 3/5 to 4+/5 and increases pain - see objective (06/10/2021);    Time 12    Period Weeks    Status New      PT LONG TERM GOAL #5   Title Complete community, work and/or recreational activities such as those that require use of B UE including ADLs, working, reaching, lifting,  carrying, sleeping, sitting without limitation due to current condition.    Baseline currently reported as funcitonal limitations (06/10/2021);    Time 12    Period Weeks    Status New      Additional Long Term Goals   Additional Long Term Goals Yes      PT LONG TERM GOAL #6   Title Reduce pain with functional activities to equal or less than 3/10 to allow patient to complete usual activities including ADLs, IADLs, work, and social engagement with less difficulty.    Baseline worst 8/10 (06/10/2021);    Time 12    Period Weeks    Status New                   Plan - 07/03/21 1049     Clinical Impression Statement Utilized in person Spanish interpreter who assisted with communication throughout the session. Patient tolerated treatment well overall and reported feeling better by end of session. B UE symptoms can be reproduced to the radial side of the hand with joint mobilization and soft tissue mobilization at mid cervical spine, suggesting radicular contribution to these symptoms. However, patient continues to complain of right elbow pain that she states is not concordant with symptoms produced by cervical spine interventions. Continued to work on postural and neck strengthening for longer term relief of symptoms. Patient would benefit from continued management of limiting condition by skilled physical therapist to address remaining impairments and functional limitations to work towards stated goals and return to PLOF or maximal functional independence.    Personal Factors and Comorbidities Comorbidity 3+;Social Background;Profession;Past/Current Experience;Time since onset of injury/illness/exacerbation;Fitness;Age    Comorbidities ARTHROSCOPIC ROTATOR CUFF REPAIR Right 10/19/11, hyperlipidemia, overweight, severe B CTS, hx of low back pain with radiculopathy    Examination-Activity Limitations Lift;Hygiene/Grooming;Caring for Others;Reach Overhead;Carry;Sleep    Examination-Participation  Restrictions Community Activity;Occupation;Cleaning;Interpersonal Relationship;Other    Stability/Clinical Decision Making Evolving/Moderate complexity    Rehab Potential Fair    PT Duration 12 weeks    PT Treatment/Interventions ADLs/Self Care Home Management;Aquatic Therapy;Cryotherapy;Moist Heat;Electrical Stimulation;Therapeutic activities;Therapeutic exercise;Neuromuscular re-education;Manual techniques;Dry needling;Spinal Manipulations;Joint Manipulations;Passive range of motion    PT Next Visit Plan update HEP  as appropriate, manual therapy as needed, postural strengthning    PT Home Exercise Plan Access Code: [email or web address]    Consulted and Agree with Plan of Care Patient             Patient will benefit from skilled therapeutic intervention in order to improve the following deficits and impairments:  Improper body mechanics, Pain, Postural dysfunction, Decreased activity tolerance, Decreased endurance, Decreased range of motion, Decreased strength, Hypomobility, Impaired perceived functional ability, Impaired UE functional use, Impaired sensation  Visit Diagnosis: Cervicalgia  Radiculopathy, cervical region  Muscle weakness (generalized)  Pain in right elbow     Problem List There are no problems to display for this patient.   Luretha Murphy. Ilsa Iha, PT, DPT 07/03/21, 10:53 AM   [facility] PHYS[facility] AND SPORTS MEDICINE [address] [place], [address] Phone: [phone]   Fax:  [phone]  Name: Jessica Holden MRN: [id number] Date of Birth: 06-03-1961

## 2021-07-07 ENCOUNTER — Ambulatory Visit: Payer: BLUE CROSS/BLUE SHIELD | Admitting: Physical Therapy

## 2021-07-09 ENCOUNTER — Ambulatory Visit: Payer: BLUE CROSS/BLUE SHIELD | Admitting: Physical Therapy

## 2021-07-14 ENCOUNTER — Ambulatory Visit: Payer: BLUE CROSS/BLUE SHIELD | Attending: Physical Medicine & Rehabilitation | Admitting: Physical Therapy

## 2021-07-14 ENCOUNTER — Encounter: Payer: Self-pay | Admitting: Physical Therapy

## 2021-07-14 DIAGNOSIS — M542 Cervicalgia: Secondary | ICD-10-CM | POA: Diagnosis not present

## 2021-07-14 DIAGNOSIS — M25521 Pain in right elbow: Secondary | ICD-10-CM | POA: Insufficient documentation

## 2021-07-14 DIAGNOSIS — M5412 Radiculopathy, cervical region: Secondary | ICD-10-CM | POA: Insufficient documentation

## 2021-07-14 DIAGNOSIS — M6281 Muscle weakness (generalized): Secondary | ICD-10-CM | POA: Insufficient documentation

## 2021-07-14 NOTE — Therapy (Signed)
[facility] [address] [place], [address] Phone: [phone]   Fax:  [phone]  Physical Therapy Treatment  Patient Details  Name: Jessica Holden MRN: [id number] Date of Birth: Feb 07, 1961 Referring Provider (PT): Filomena Jungling, MD   Encounter Date: 07/14/2021   PT End of Session - 07/14/21 1741     Visit Number 7    Number of Visits 24    Date for PT Re-Evaluation 09/02/21    Authorization Type [employer] reporting period from 06/10/2021    Authorization - Visit Number 7    Authorization - Number of Visits 30    Progress Note Due on Visit 10    PT Start Time 1733    PT Stop Time 1813    PT Time Calculation (min) 40 min    Activity Tolerance Patient tolerated treatment well    Behavior During Therapy [facility] for tasks assessed/performed             Past Medical History:  Diagnosis Date   Hyperlipidemia     Past Surgical History:  Procedure Laterality Date   ARTHROSCOPIC ROTATOR CUFF REPAIR Right 10/19/2011    There were no vitals filed for this visit.   Subjective Assessment - 07/14/21 1735     Subjective Utilized in person Spanish interpreter who assisted with communication throughout session. Patient reports her right elbow has improved but not as much as she would like to. She is almost there to go back to work and her elbow still hurts. States her neck and arms otherwise are much better. She did have some pain in her left hand over the weekend. States she felt better after PT. Her husband is returning to work at the end of the month. She would like to go back to work before the end of the month. She has been off work to help care for her husband. Also the area where the bra strap crosses the top of her shoulder and her hand will get a little numb and when she tries to lift her arm up she will feel pain there as well. She has tried different bras. Unsure if there is a  relationship between the bra and arm.  She has elbow pain on both sides but right is more than left.    Patient is accompained by: Interpreter    Pertinent History Patient is a 60 y.o. female who presents to outpatient physical therapy with a referral for medical diagnosis cervicalgia and cervical radiculopathy. This patient's chief complaints consist of constant lower neck pain and B UE paresthesia with intermittent headaches and pain to B hands and down back. Also complains of intermittent R sided LE numbness and R elbow pain and feeling it is "dislocated." Upper quarter, neck, head complaints lead to the following functional deficits: Difficulty with activities that require use of B UE including ADLs, working, reaching, lifting, carrying, sleeping, sitting. Relevant past medical history and comorbidities include ARTHROSCOPIC ROTATOR CUFF REPAIR Right 10/19/11, hyperlipidemia, overweight, severe B CTS, hx of low back pain with radicular symptoms.   Patient denies hx of cancer, stroke, spinal surgeries, lung problems, HTN, heart problems, diabetes, unexplained weight loss. Doe note some urinary incontinence and feeling of something in her vagina that has not changed with neck symptoms.    Limitations Sitting;Lifting;House hold activities;Other (comment)    Diagnostic tests States she had a nerve conduction study at the wrists but she stopped them from doing the neck.  Results not available to PT at this time.    Patient Stated Goals would like to improve her problems in her neck and arms by the time she needs to go back to work.    Currently in Pain? Yes    Pain Score 6                TREATMENT:  Manual therapy: to reduce pain and tissue tension, improve range of motion, neuromodulation, in order to promote improved ability to complete functional activities. HOOKLYING - STM to posterior cervical spine focusing on bilateral suboccipital region, cervical paraspinals, levator scapulae, and upper  traps. - gentle manual cervical spine distraction    Therapeutic exercise: to centralize symptoms and improve ROM, strength, muscular endurance, and activity tolerance required for successful completion of functional activities.  - hooklying chin tuck for deep neck flexor training 1x20 with 5 second hold.  - hooklying chin tuck to lift, 2x10, with 2 second hold   PRONE - scapular retraction with hands by sides with lift off, 1x20, with 5 second hold - prone scapular retraction W. 1x10. 5 second hold.     Pt required multimodal cuing for proper technique and to facilitate improved neuromuscular control, strength, range of motion, and functional ability resulting in improved performance and form.   HOME EXERCISE PROGRAM Access Code: [email or web address] URL: [email or web address] Date: 07/03/2021 Prepared by: Norton Blizzard   Exercises Supine Chin Tuck - 1 x daily - 1 sets - 20 reps - 5 seconds hold Supine Deep Neck Flexor Training - Repetitions - 2 sets - 10 reps - 2 seconds hold Prone Scapular Slide with Shoulder Extension - 2 sets - 10 reps - 2-5 seconds hold Prone W Scapular Retraction - 2 sets - 10 reps - 2-5 seconds hold Sidelying Thoracic Rotation with Open Book - 1-2 x daily - 1 sets - 20 reps - 5 seconds hold Wrist Tendon Gliding - 3 x daily - 1 sets - 10 reps Row with band/cable - 1 x daily - 3 sets - 10 reps    PT Education - 07/14/21 1740     Education Details exercise/intervention purpose/form,    Person(s) Educated Patient    Methods Explanation;Demonstration;Tactile cues;Verbal cues    Comprehension Verbalized understanding;Returned demonstration;Verbal cues required;Tactile cues required;Need further instruction              PT Short Term Goals - 06/16/21 1244       PT SHORT TERM GOAL #1   Title Be independent with initial home exercise program for self-management of symptoms.    Baseline Initial HEP to be provided at visit 2 as appropriate (06/10/2021);     Time 2    Period Weeks    Status Achieved    Target Date 06/25/21               PT Long Term Goals - 06/11/21 0949       PT LONG TERM GOAL #1   Title Be independent with a long-term home exercise program for self-management of symptoms.    Baseline Initial HEP to be provided at visit 2 as appropriate (06/10/2021);    Time 12    Period Weeks    Status New   TARGET DATE FOR ALL LONG TERM GOALS: 09/02/2021     PT LONG TERM GOAL #2   Title Demonstrate improved FOTO score by 10 units to demonstrate improvement in overall condition and self-reported functional ability.    Baseline to be measured visit 2  as appropriate (06/10/2021);    Time 12    Period Weeks    Status New      PT LONG TERM GOAL #3   Title Have full bilateral shoulder AROM with no compensations or increase in pain in all planes except intermittent end range discomfort to allow patient to complete valued activities such as reaching with less difficulty.    Baseline overhead ROM limited - see objective (06/11/2021);    Time 12    Period Weeks    Status New      PT LONG TERM GOAL #4   Title Improve B UE strength to equal or greater than 4+/5 with no increase in pain to improve patient's ability to complete  valued functional tasks working, lifting, carrying, reaching with less difficulty.    Baseline Ranges from 3/5 to 4+/5 and increases pain - see objective (06/10/2021);    Time 12    Period Weeks    Status New      PT LONG TERM GOAL #5   Title Complete community, work and/or recreational activities such as those that require use of B UE including ADLs, working, reaching, lifting, carrying, sleeping, sitting without limitation due to current condition.    Baseline currently reported as funcitonal limitations (06/10/2021);    Time 12    Period Weeks    Status New      Additional Long Term Goals   Additional Long Term Goals Yes      PT LONG TERM GOAL #6   Title Reduce pain with functional activities to equal or less  than 3/10 to allow patient to complete usual activities including ADLs, IADLs, work, and social engagement with less difficulty.    Baseline worst 8/10 (06/10/2021);    Time 12    Period Weeks    Status New                   Plan - 07/14/21 1815     Clinical Impression Statement Utilized in person Spanish interpreter who assisted with communication throughout the session. Patient tolerated treatment well overall and continues to report relief from manual therapy. Reports exercises are challenging and was educated on their purpose and importance. Patient would benefit from continued management of limiting condition by skilled physical therapist to address remaining impairments and functional limitations to work towards stated goals and return to PLOF or maximal functional independence.    Personal Factors and Comorbidities Comorbidity 3+;Social Background;Profession;Past/Current Experience;Time since onset of injury/illness/exacerbation;Fitness;Age    Comorbidities ARTHROSCOPIC ROTATOR CUFF REPAIR Right 10/19/11, hyperlipidemia, overweight, severe B CTS, hx of low back pain with radiculopathy    Examination-Activity Limitations Lift;Hygiene/Grooming;Caring for Others;Reach Overhead;Carry;Sleep    Examination-Participation Restrictions Community Activity;Occupation;Cleaning;Interpersonal Relationship;Other    Stability/Clinical Decision Making Evolving/Moderate complexity    Rehab Potential Fair    PT Duration 12 weeks    PT Treatment/Interventions ADLs/Self Care Home Management;Aquatic Therapy;Cryotherapy;Moist Heat;Electrical Stimulation;Therapeutic activities;Therapeutic exercise;Neuromuscular re-education;Manual techniques;Dry needling;Spinal Manipulations;Joint Manipulations;Passive range of motion    PT Next Visit Plan update HEP as appropriate, manual therapy as needed, postural strengthning    PT Home Exercise Plan Access Code: [email or web address]    Consulted and Agree with Plan of Care  Patient             Patient will benefit from skilled therapeutic intervention in order to improve the following deficits and impairments:  Improper body mechanics, Pain, Postural dysfunction, Decreased activity tolerance, Decreased endurance, Decreased range of motion, Decreased strength, Hypomobility, Impaired perceived functional ability, Impaired  UE functional use, Impaired sensation  Visit Diagnosis: Cervicalgia  Radiculopathy, cervical region  Muscle weakness (generalized)  Pain in right elbow     Problem List There are no problems to display for this patient.   Luretha Murphy. Ilsa Iha, PT, DPT 07/14/21, 6:16 PM   [facility] PHYSICAL AND SPORTS MEDICINE [address], [place], [address] Phone: [phone]   Fax:  [phone]  Name: Braidyn Peace MRN: [id number] Date of Birth: January 03, 1961

## 2021-07-16 ENCOUNTER — Encounter: Payer: Self-pay | Admitting: Physical Therapy

## 2021-07-16 ENCOUNTER — Ambulatory Visit: Payer: BLUE CROSS/BLUE SHIELD | Admitting: Physical Therapy

## 2021-07-16 DIAGNOSIS — M542 Cervicalgia: Secondary | ICD-10-CM

## 2021-07-16 DIAGNOSIS — M25521 Pain in right elbow: Secondary | ICD-10-CM

## 2021-07-16 DIAGNOSIS — M5412 Radiculopathy, cervical region: Secondary | ICD-10-CM

## 2021-07-16 DIAGNOSIS — M6281 Muscle weakness (generalized): Secondary | ICD-10-CM

## 2021-07-16 NOTE — Therapy (Addendum)
[facility] [address] [place], [address] Phone: [phone]   Fax:  [phone]  Physical Therapy Treatment  Patient Details  Name: Jessica Holden MRN: [id number] Date of Birth: 03/03/1961 Referring Provider (PT): Filomena Jungling, MD   Encounter Date: 07/16/2021   PT End of Session - 07/16/21 1000     Visit Number 8    Number of Visits 24    Date for PT Re-Evaluation 09/02/21    Authorization Type [employer] reporting period from 06/10/2021    Authorization - Visit Number 8    Authorization - Number of Visits 30    Progress Note Due on Visit 10    PT Start Time 0954    PT Stop Time 1029    PT Time Calculation (min) 35 min    Activity Tolerance Patient tolerated treatment well    Behavior During Therapy [facility] for tasks assessed/performed             Past Medical History:  Diagnosis Date   Hyperlipidemia     Past Surgical History:  Procedure Laterality Date   ARTHROSCOPIC ROTATOR CUFF REPAIR Right 10/19/2011    There were no vitals filed for this visit.   Subjective Assessment - 07/16/21 0956     Subjective Utilized in person Spanish interpreter who assisted with communication throughout session. Patient would like to know if she needs massage on her arms like she does at her neck. States her right elbow is feeling better today. She feels that the muscles and nerves are tight in her arms as well and might benefit from massage. Reports pain today is almost none , but if she stretches out her arm she can feel the nerve stretching out (points to right forearm) states it can be both but more on right than left.    Patient is accompained by: Interpreter    Pertinent History Patient is a 60 y.o. female who presents to outpatient physical therapy with a referral for medical diagnosis cervicalgia and cervical radiculopathy. This patient's chief complaints consist of constant lower neck  pain and B UE paresthesia with intermittent headaches and pain to B hands and down back. Also complains of intermittent R sided LE numbness and R elbow pain and feeling it is "dislocated." Upper quarter, neck, head complaints lead to the following functional deficits: Difficulty with activities that require use of B UE including ADLs, working, reaching, lifting, carrying, sleeping, sitting. Relevant past medical history and comorbidities include ARTHROSCOPIC ROTATOR CUFF REPAIR Right 10/19/11, hyperlipidemia, overweight, severe B CTS, hx of low back pain with radicular symptoms.   Patient denies hx of cancer, stroke, spinal surgeries, lung problems, HTN, heart problems, diabetes, unexplained weight loss. Doe note some urinary incontinence and feeling of something in her vagina that has not changed with neck symptoms.    Limitations Sitting;Lifting;House hold activities;Other (comment)    Diagnostic tests States she had a nerve conduction study at the wrists but she stopped them from doing the neck. Results not available to PT at this time.    Patient Stated Goals would like to improve her problems in her neck and arms by the time she needs to go back to work.    Currently in Pain? No/denies               TREATMENT:  Manual therapy: to reduce pain and tissue tension, improve range of motion, neuromodulation, in order to promote improved ability to complete functional  activities. HOOKLYING - STM to posterior cervical spine focusing on bilateral suboccipital region, cervical paraspinals, levator scapulae, and upper traps. - gentle manual cervical spine distraction  - STM to bilateral posterior rotator cuff, upper arms, forearms to decrease pain and tension.  - R PROM nerve glide (gentle tensioner) for median nerve, 1x10   Therapeutic exercise: to centralize symptoms and improve ROM, strength, muscular endurance, and activity tolerance required for successful completion of functional activities.  -  standing scapular row with 10# cable, 1x10 with cuing for proper form.    Pt required multimodal cuing for proper technique and to facilitate improved neuromuscular control, strength, range of motion, and functional ability resulting in improved performance and form.   HOME EXERCISE PROGRAM Access Code: [email or web address] URL: [email or web address] Date: 07/03/2021 Prepared by: Norton Blizzard   Exercises Supine Chin Tuck - 1 x daily - 1 sets - 20 reps - 5 seconds hold Supine Deep Neck Flexor Training - Repetitions - 2 sets - 10 reps - 2 seconds hold Prone Scapular Slide with Shoulder Extension - 2 sets - 10 reps - 2-5 seconds hold Prone W Scapular Retraction - 2 sets - 10 reps - 2-5 seconds hold Sidelying Thoracic Rotation with Open Book - 1-2 x daily - 1 sets - 20 reps - 5 seconds hold Wrist Tendon Gliding - 3 x daily - 1 sets - 10 reps Row with band/cable - 1 x daily - 3 sets - 10 reps      PT Education - 07/16/21 0959     Education Details exercise/intervention purpose/form,    Person(s) Educated Patient    Methods Explanation;Demonstration;Tactile cues;Verbal cues    Comprehension Verbalized understanding;Returned demonstration;Verbal cues required;Tactile cues required;Need further instruction              PT Short Term Goals - 06/16/21 1244       PT SHORT TERM GOAL #1   Title Be independent with initial home exercise program for self-management of symptoms.    Baseline Initial HEP to be provided at visit 2 as appropriate (06/10/2021);    Time 2    Period Weeks    Status Achieved    Target Date 06/25/21               PT Long Term Goals - 06/11/21 0949       PT LONG TERM GOAL #1   Title Be independent with a long-term home exercise program for self-management of symptoms.    Baseline Initial HEP to be provided at visit 2 as appropriate (06/10/2021);    Time 12    Period Weeks    Status New   TARGET DATE FOR ALL LONG TERM GOALS: 09/02/2021     PT LONG  TERM GOAL #2   Title Demonstrate improved FOTO score by 10 units to demonstrate improvement in overall condition and self-reported functional ability.    Baseline to be measured visit 2 as appropriate (06/10/2021);    Time 12    Period Weeks    Status New      PT LONG TERM GOAL #3   Title Have full bilateral shoulder AROM with no compensations or increase in pain in all planes except intermittent end range discomfort to allow patient to complete valued activities such as reaching with less difficulty.    Baseline overhead ROM limited - see objective (06/11/2021);    Time 12    Period Weeks    Status New      PT LONG TERM GOAL #  4   Title Improve B UE strength to equal or greater than 4+/5 with no increase in pain to improve patient's ability to complete  valued functional tasks working, lifting, carrying, reaching with less difficulty.    Baseline Ranges from 3/5 to 4+/5 and increases pain - see objective (06/10/2021);    Time 12    Period Weeks    Status New      PT LONG TERM GOAL #5   Title Complete community, work and/or recreational activities such as those that require use of B UE including ADLs, working, reaching, lifting, carrying, sleeping, sitting without limitation due to current condition.    Baseline currently reported as funcitonal limitations (06/10/2021);    Time 12    Period Weeks    Status New      Additional Long Term Goals   Additional Long Term Goals Yes      PT LONG TERM GOAL #6   Title Reduce pain with functional activities to equal or less than 3/10 to allow patient to complete usual activities including ADLs, IADLs, work, and social engagement with less difficulty.    Baseline worst 8/10 (06/10/2021);    Time 12    Period Weeks    Status New                   Plan - 07/16/21 1404     Clinical Impression Statement In person Spanish interpreter present and assisted with communication throughout the session. Patient tolerated treatment well and continues  to respond well to manual therapy. Patient noted to have reproduction of elbow pain and sensation in the first three finger digits with pressure to the posterior rotator cuff. Would benefit from further STM to this region in the future. Patient would also benefit form progressions of postural exercises to improve activity tolerance, now that pain is better controlled. Patient would benefit from continued management of limiting condition by skilled physical therapist to address remaining impairments and functional limitations to work towards stated goals and return to PLOF or maximal functional independence.    Personal Factors and Comorbidities Comorbidity 3+;Social Background;Profession;Past/Current Experience;Time since onset of injury/illness/exacerbation;Fitness;Age    Comorbidities ARTHROSCOPIC ROTATOR CUFF REPAIR Right 10/19/11, hyperlipidemia, overweight, severe B CTS, hx of low back pain with radiculopathy    Examination-Activity Limitations Lift;Hygiene/Grooming;Caring for Others;Reach Overhead;Carry;Sleep    Examination-Participation Restrictions Community Activity;Occupation;Cleaning;Interpersonal Relationship;Other    Stability/Clinical Decision Making Evolving/Moderate complexity    Rehab Potential Fair    PT Duration 12 weeks    PT Treatment/Interventions ADLs/Self Care Home Management;Aquatic Therapy;Cryotherapy;Moist Heat;Electrical Stimulation;Therapeutic activities;Therapeutic exercise;Neuromuscular re-education;Manual techniques;Dry needling;Spinal Manipulations;Joint Manipulations;Passive range of motion    PT Next Visit Plan update HEP as appropriate, manual therapy as needed, postural strengthning    PT Home Exercise Plan Access Code: [email or web address]    Consulted and Agree with Plan of Care Patient             Patient will benefit from skilled therapeutic intervention in order to improve the following deficits and impairments:  Improper body mechanics, Pain, Postural dysfunction,  Decreased activity tolerance, Decreased endurance, Decreased range of motion, Decreased strength, Hypomobility, Impaired perceived functional ability, Impaired UE functional use, Impaired sensation  Visit Diagnosis: Cervicalgia  Radiculopathy, cervical region  Muscle weakness (generalized)  Pain in right elbow     Problem List There are no problems to display for this patient.   Luretha Murphy. Ilsa Iha, PT, DPT 07/16/21, 2:07 PM    [facility]  [address], [place], [address] Phone: [phone]   Fax:  [phone]  Name: Magdaline Zollars MRN: [id number] Date of Birth: 1961/06/04

## 2021-07-23 ENCOUNTER — Ambulatory Visit: Payer: BLUE CROSS/BLUE SHIELD | Admitting: Physical Therapy

## 2021-07-23 ENCOUNTER — Encounter: Payer: Self-pay | Admitting: Physical Therapy

## 2021-07-23 DIAGNOSIS — M6281 Muscle weakness (generalized): Secondary | ICD-10-CM

## 2021-07-23 DIAGNOSIS — M542 Cervicalgia: Secondary | ICD-10-CM | POA: Diagnosis not present

## 2021-07-23 DIAGNOSIS — M5412 Radiculopathy, cervical region: Secondary | ICD-10-CM

## 2021-07-23 DIAGNOSIS — M25521 Pain in right elbow: Secondary | ICD-10-CM

## 2021-07-23 NOTE — Therapy (Signed)
[facility] [address], [place], [address] Phone: [phone]   Fax:  [phone]  Physical Therapy Treatment  Patient Details  Name: Jessica Holden MRN: [id number] Date of Birth: 12-29-1960 Referring Provider (PT): Filomena Jungling, MD   Encounter Date: 07/23/2021   PT End of Session - 07/23/21 1015     Visit Number 9    Number of Visits 24    Date for PT Re-Evaluation 09/02/21    Authorization Type [employer] reporting period from 06/10/2021    Authorization - Visit Number 9    Authorization - Number of Visits 30    Progress Note Due on Visit 10    PT Start Time 0903    PT Stop Time 0947    PT Time Calculation (min) 44 min    Activity Tolerance Patient tolerated treatment well    Behavior During Therapy [facility] for tasks assessed/performed             Past Medical History:  Diagnosis Date   Hyperlipidemia     Past Surgical History:  Procedure Laterality Date   ARTHROSCOPIC ROTATOR CUFF REPAIR Right 10/19/2011    There were no vitals filed for this visit.   Subjective Assessment - 07/23/21 0937     Subjective Patient accompanied by in person Spanish interpreter who assisted with communication throughout session. Patient reports she feels good today except she slept wrong on the R arm so it was sore for a while. Yesterday she was feeding her cat and stepped wrong and it flaired up her sciatic nerve. States her hands are doing well except intermittant paresthesia in digits 2-3 in the left hand. R elbow has been better but she has had a lot of discomfort with stretching and exercises causing pain at right shoulder/UT region. States she feels the exercises are ultimately helpful though. Rates current pain worst at R shoulder to elbow. States it is not neccessarily pain but tension and rates it 5/10.    Patient is accompained by: Interpreter    Pertinent History Patient is a 60 y.o.  female who presents to outpatient physical therapy with a referral for medical diagnosis cervicalgia and cervical radiculopathy. This patient's chief complaints consist of constant lower neck pain and B UE paresthesia with intermittent headaches and pain to B hands and down back. Also complains of intermittent R sided LE numbness and R elbow pain and feeling it is "dislocated." Upper quarter, neck, head complaints lead to the following functional deficits: Difficulty with activities that require use of B UE including ADLs, working, reaching, lifting, carrying, sleeping, sitting. Relevant past medical history and comorbidities include ARTHROSCOPIC ROTATOR CUFF REPAIR Right 10/19/11, hyperlipidemia, overweight, severe B CTS, hx of low back pain with radicular symptoms.   Patient denies hx of cancer, stroke, spinal surgeries, lung problems, HTN, heart problems, diabetes, unexplained weight loss. Doe note some urinary incontinence and feeling of something in her vagina that has not changed with neck symptoms.    Limitations Sitting;Lifting;House hold activities;Other (comment)    Diagnostic tests States she had a nerve conduction study at the wrists but she stopped them from doing the neck. Results not available to PT at this time.    Patient Stated Goals would like to improve her problems in her neck and arms by the time she needs to go back to work.    Currently in Pain? Yes    Pain Score 5  TREATMENT:  Manual therapy: to reduce pain and tissue tension, improve range of motion, neuromodulation, in order to promote improved ability to complete functional activities. HOOKLYING - STM to posterior cervical spine focusing on bilateral suboccipital region, cervical paraspinals, levator scapulae, and upper traps. - gentle manual cervical spine distraction  - STM to bilateral posterior rotator cuff - L PROM nerve glide (gentle tensioner) for median nerve and radial nerve, 1x10 each moving at  wrist, 1x10 each moving at elbow.  - R PROM nerve glide (gentle tensioner) for median nerve and radial nerve, 1x10 each moving at wrist.    Therapeutic exercise: to centralize symptoms and improve ROM, strength, muscular endurance, and activity tolerance required for successful completion of functional activities.  - hooklying chin tuck, 1x10 (good form) - hooklying chin tuck to lift, 1x10 (good form) - prone scapular retraction with slight B shoulder extension, 1x10  - prone scapular retraction with B W position, 1x10 - prone scapular angels, 1x10 (pt reports difficult).  - seated bicep curls, 2# DB each hand, 1x20 (reports some discomfort at right elbow that resolves with rest).  - seated overhead press with 2# DB each hand, 1x10 (reports feels better than curl, noted for incomplete ROM overhead).  - Education on HEP including handout    Pt required multimodal cuing for proper technique and to facilitate improved neuromuscular control, strength, range of motion, and functional ability resulting in improved performance and form.   HOME EXERCISE PROGRAM Access Code: [email or web address] URL: [email or web address] Date: 07/23/2021 Prepared by: Norton Blizzard  Exercises Supine Chin Tuck - 1 x daily - 1 sets - 20 reps - 5 seconds hold Supine Deep Neck Flexor Training - Repetitions - 2 sets - 10 reps - 2 seconds hold Prone W Scapular Retraction - 2 sets - 10 reps - 2-5 seconds hold Prone Angels - 1 x daily - 3 sets - 10 reps Sidelying Thoracic Rotation with Open Book - 1-2 x daily - 1 sets - 20 reps - 5 seconds hold Wrist Tendon Gliding - 3 x daily - 1 sets - 10 reps Row with band/cable - 1 x daily - 3 sets - 10 reps     PT Education - 07/23/21 1017     Education Details exercise/intervention purpose/form, hep    Person(s) Educated Patient    Methods Explanation;Demonstration;Tactile cues;Verbal cues;Handout    Comprehension Verbalized understanding;Returned demonstration;Verbal cues  required;Tactile cues required;Need further instruction              PT Short Term Goals - 06/16/21 1244       PT SHORT TERM GOAL #1   Title Be independent with initial home exercise program for self-management of symptoms.    Baseline Initial HEP to be provided at visit 2 as appropriate (06/10/2021);    Time 2    Period Weeks    Status Achieved    Target Date 06/25/21               PT Long Term Goals - 06/11/21 0949       PT LONG TERM GOAL #1   Title Be independent with a long-term home exercise program for self-management of symptoms.    Baseline Initial HEP to be provided at visit 2 as appropriate (06/10/2021);    Time 12    Period Weeks    Status New   TARGET DATE FOR ALL LONG TERM GOALS: 09/02/2021     PT LONG TERM GOAL #2   Title Demonstrate improved FOTO score  by 10 units to demonstrate improvement in overall condition and self-reported functional ability.    Baseline to be measured visit 2 as appropriate (06/10/2021);    Time 12    Period Weeks    Status New      PT LONG TERM GOAL #3   Title Have full bilateral shoulder AROM with no compensations or increase in pain in all planes except intermittent end range discomfort to allow patient to complete valued activities such as reaching with less difficulty.    Baseline overhead ROM limited - see objective (06/11/2021);    Time 12    Period Weeks    Status New      PT LONG TERM GOAL #4   Title Improve B UE strength to equal or greater than 4+/5 with no increase in pain to improve patient's ability to complete  valued functional tasks working, lifting, carrying, reaching with less difficulty.    Baseline Ranges from 3/5 to 4+/5 and increases pain - see objective (06/10/2021);    Time 12    Period Weeks    Status New      PT LONG TERM GOAL #5   Title Complete community, work and/or recreational activities such as those that require use of B UE including ADLs, working, reaching, lifting, carrying, sleeping, sitting  without limitation due to current condition.    Baseline currently reported as funcitonal limitations (06/10/2021);    Time 12    Period Weeks    Status New      Additional Long Term Goals   Additional Long Term Goals Yes      PT LONG TERM GOAL #6   Title Reduce pain with functional activities to equal or less than 3/10 to allow patient to complete usual activities including ADLs, IADLs, work, and social engagement with less difficulty.    Baseline worst 8/10 (06/10/2021);    Time 12    Period Weeks    Status New                   Plan - 07/23/21 1019     Clinical Impression Statement Patient tolerated treatment well overall and continues to report overall improvement in condition. Continued to educate on appropriate response to discomfort and encouraged her to continue with exercises as long as they did not provoke lasting pain. Continues to have tightness in posterior rotator cuff bilaterally but not as prominent as last session. Patient tolerated exercise progressions well and would benefit from further progressions at next session as appropriate. Patient would benefit from continued management of limiting condition by skilled physical therapist to address remaining impairments and functional limitations to work towards stated goals and return to PLOF or maximal functional independence.    Personal Factors and Comorbidities Comorbidity 3+;Social Background;Profession;Past/Current Experience;Time since onset of injury/illness/exacerbation;Fitness;Age    Comorbidities ARTHROSCOPIC ROTATOR CUFF REPAIR Right 10/19/11, hyperlipidemia, overweight, severe B CTS, hx of low back pain with radiculopathy    Examination-Activity Limitations Lift;Hygiene/Grooming;Caring for Others;Reach Overhead;Carry;Sleep    Examination-Participation Restrictions Community Activity;Occupation;Cleaning;Interpersonal Relationship;Other    Stability/Clinical Decision Making Evolving/Moderate complexity    Rehab  Potential Fair    PT Duration 12 weeks    PT Treatment/Interventions ADLs/Self Care Home Management;Aquatic Therapy;Cryotherapy;Moist Heat;Electrical Stimulation;Therapeutic activities;Therapeutic exercise;Neuromuscular re-education;Manual techniques;Dry needling;Spinal Manipulations;Joint Manipulations;Passive range of motion    PT Next Visit Plan update HEP as appropriate, manual therapy as needed, postural strengthning    PT Home Exercise Plan Access Code: [email or web address]    Consulted and Agree with Plan of  Care Patient             Patient will benefit from skilled therapeutic intervention in order to improve the following deficits and impairments:  Improper body mechanics, Pain, Postural dysfunction, Decreased activity tolerance, Decreased endurance, Decreased range of motion, Decreased strength, Hypomobility, Impaired perceived functional ability, Impaired UE functional use, Impaired sensation  Visit Diagnosis: Cervicalgia  Radiculopathy, cervical region  Muscle weakness (generalized)  Pain in right elbow     Problem List There are no problems to display for this patient.   Luretha Murphy. Ilsa Iha, PT, DPT 07/23/21, 10:20 AM   [facility] [address], [place], [address] Phone: [phone]   Fax:  [phone]  Name: Kynzie Polgar MRN: [id number] Date of Birth: Oct 24, 1960

## 2021-07-28 ENCOUNTER — Encounter: Payer: Self-pay | Admitting: Physical Therapy

## 2021-07-28 ENCOUNTER — Ambulatory Visit: Payer: BLUE CROSS/BLUE SHIELD | Admitting: Physical Therapy

## 2021-07-28 DIAGNOSIS — M5412 Radiculopathy, cervical region: Secondary | ICD-10-CM

## 2021-07-28 DIAGNOSIS — M542 Cervicalgia: Secondary | ICD-10-CM | POA: Diagnosis not present

## 2021-07-28 DIAGNOSIS — M25521 Pain in right elbow: Secondary | ICD-10-CM

## 2021-07-28 DIAGNOSIS — M6281 Muscle weakness (generalized): Secondary | ICD-10-CM

## 2021-07-28 NOTE — Therapy (Signed)
[facility] [address], [place], [address] Phone: [phone]   Fax:  [phone]  Physical Therapy Treatment / Progress Note Dates of reporting from 06/10/2021 - 07/28/2021  Patient Details  Name: Jessica Holden MRN: [id number] Date of Birth: 1961/03/08 Referring Provider (PT): Girtha Hake, MD   Encounter Date: 07/28/2021   PT End of Session - 07/28/21 0932     Visit Number 10    Number of Visits 24    Date for PT Re-Evaluation 09/02/21    Authorization Type [employer] reporting period from 06/10/2021    Authorization - Visit Number 10    Authorization - Number of Visits 30    Progress Note Due on Visit 10    PT Start Time 0903    PT Stop Time 0943    PT Time Calculation (min) 40 min    Activity Tolerance Patient tolerated treatment well    Behavior During Therapy [facility] for tasks assessed/performed             Past Medical History:  Diagnosis Date   Hyperlipidemia     Past Surgical History:  Procedure Laterality Date   ARTHROSCOPIC ROTATOR CUFF REPAIR Right 10/19/2011    There were no vitals filed for this visit.   Subjective Assessment - 07/28/21 0908     Subjective Patient accompanied by in person Spanish interpreter who assisted with communication throughout session.  Patient reports L ebow to forearm hurts only with certain movement., her left neck twinges with pain at times, right elbow to forearm is currently 6/10 and radiates to finger. She felt okay after last PT session except she was a bit tired. Patient states her neck and shoulders have been a lot less tense. She feels PT is helping her. States her fingers have continued to go numb no matter what. Reports highest level of pain in the last 2 week is 6/10 at the right elbow region. States her HEP has been going well except she has not purchased weights and only has a 6 lb weight that she can only tolerate using on the  left UE but not the R UE. Reports overall her neck and upper trap region is much better than when she started PT but her hands and forearms continue to really bother her and go numb.    Patient is accompained by: Interpreter    Pertinent History Patient is a 60 y.o. female who presents to outpatient physical therapy with a referral for medical diagnosis cervicalgia and cervical radiculopathy. This patient's chief complaints consist of constant lower neck pain and B UE paresthesia with intermittent headaches and pain to B hands and down back. Also complains of intermittent R sided LE numbness and R elbow pain and feeling it is "dislocated." Upper quarter, neck, head complaints lead to the following functional deficits: Difficulty with activities that require use of B UE including ADLs, working, reaching, lifting, carrying, sleeping, sitting. Relevant past medical history and comorbidities include ARTHROSCOPIC ROTATOR CUFF REPAIR Right 10/19/11, hyperlipidemia, overweight, severe B CTS, hx of low back pain with radicular symptoms.   Patient denies hx of cancer, stroke, spinal surgeries, lung problems, HTN, heart problems, diabetes, unexplained weight loss. Doe note some urinary incontinence and feeling of something in her vagina that has not changed with neck symptoms.    Limitations Sitting;Lifting;House hold activities;Other (comment)    Diagnostic tests States she had a nerve conduction study at the wrists but she stopped  them from doing the neck. Results not available to PT at this time.    Patient Stated Goals would like to improve her problems in her neck and arms by the time she needs to go back to work.    Currently in Pain? Yes    Pain Score 6     Effect of Pain on Daily Activities Functional Limitations: Difficulty with activities that require use of B UE including ADLs, working, reaching, lifting, carrying, sleeping, sitting.             OBJECTIVE  SELF-REPORTED FUNCTION FOTO score: 53/100  (neck questionnaire)   SPINE MOTION Cervical Spine AROM *denotes pain Flexion: = 55 increased tension Extension: = 45 increased pain at bilateral base of scull and base of neck.  Rotation: R= 70 ipsilateral pain from neck to elbow, L = 75 ipsilateral pain from neck to elbow Side Flexion: R= 35 pull at contralateral neck, L = 44 pull at contralateral neck Flexion/Extension, side bending did not affect hands/arms   PERIPHERAL JOINT MOTION (in degrees) Active Range of Motion (AROM) Shoulder  Flexion: R = 145, L = 145.  Abduction: R = 109*, L = 133*. Extension: R = 50,* L = 50 numbness at posterior upper arm External rotation: R = 104*, L = 92*. Internal rotation: R = T12*, L = T9  MUSCLE PERFORMANCE (MMT):  *Indicates pain 06/10/21 07/28/21 Date  Joint/Motion R/L R/L R/L  Shoulder        Elevation 4+/4+ 5/5    Flexion 3+*/3* 3+*/4 /  Abduction 4*/4* 3+*/3+* /  External rotation 3*/4 3*/4* /  Internal rotation 3+*/3+* 4+/4+ /  Elbow        Flexion 4/4 4+/4+ /  Extension 3+*/3 4+*/4+ /      TREATMENT:    Therapeutic exercise: to centralize symptoms and improve ROM, strength, muscular endurance, and activity tolerance required for successful completion of functional activities.  - measurements to assess progress (see above) - education about progress, POC, shared decision making.   Pt required multimodal cuing for proper technique and to facilitate improved neuromuscular control, strength, range of motion, and functional ability resulting in improved performance and form.   HOME EXERCISE PROGRAM Access Code: [email or web address] URL: [email or web address] Date: 07/23/2021 Prepared by: Rosita Kea   Exercises Supine Chin Tuck - 1 x daily - 1 sets - 20 reps - 5 seconds hold Supine Deep Neck Flexor Training - Repetitions - 2 sets - 10 reps - 2 seconds hold Prone W Scapular Retraction - 2 sets - 10 reps - 2-5 seconds hold Prone Angels - 1 x daily - 3 sets - 10  reps Sidelying Thoracic Rotation with Open Book - 1-2 x daily - 1 sets - 20 reps - 5 seconds hold Wrist Tendon Gliding - 3 x daily - 1 sets - 10 reps Row with band/cable - 1 x daily - 3 sets - 10 reps     PT Education - 07/28/21 1358     Education Details exercise/intervention purpose/form, POC, progress    Person(s) Educated Patient    Methods Explanation;Demonstration;Tactile cues;Verbal cues    Comprehension Verbalized understanding;Returned demonstration;Verbal cues required;Tactile cues required;Need further instruction              PT Short Term Goals - 06/16/21 1244       PT SHORT TERM GOAL #1   Title Be independent with initial home exercise program for self-management of symptoms.    Baseline Initial HEP to be provided at visit  2 as appropriate (06/10/2021);    Time 2    Period Weeks    Status Achieved    Target Date 06/25/21               PT Long Term Goals - 07/28/21 0933       PT LONG TERM GOAL #1   Title Be independent with a long-term home exercise program for self-management of symptoms.    Baseline Initial HEP to be provided at visit 2 as appropriate (06/10/2021); currently participating in appropriate HEP (07/28/2021);    Time 12    Period Weeks    Status Partially Met   TARGET DATE FOR ALL LONG TERM GOALS: 09/02/2021     PT LONG TERM GOAL #2   Title Demonstrate improved FOTO score by 10 units to demonstrate improvement in overall condition and self-reported functional ability.    Baseline to be measured visit 2 as appropriate (06/10/2021); 51 at visit 3 (06/16/2021); 53 (07/28/2021);    Time 12    Period Weeks    Status Partially Met      PT LONG TERM GOAL #3   Title Have full bilateral shoulder AROM with no compensations or increase in pain in all planes except intermittent end range discomfort to allow patient to complete valued activities such as reaching with less difficulty.    Baseline overhead ROM limited - see objective (06/11/2021); continues  to be limited but is better than before - see objective (07/28/2021);    Time 12    Period Weeks    Status Partially Met      PT LONG TERM GOAL #4   Title Improve B UE strength to equal or greater than 4+/5 with no increase in pain to improve patient's ability to complete  valued functional tasks working, lifting, carrying, reaching with less difficulty.    Baseline Ranges from 3/5 to 4+/5 and increases pain - see objective (06/10/2021); most improved except shoulder abduction - continues to be painful and limited (07/28/2021);    Time 12    Period Weeks    Status Partially Met      PT LONG TERM GOAL #5   Title Complete community, work and/or recreational activities such as those that require use of B UE including ADLs, working, reaching, lifting, carrying, sleeping, sitting without limitation due to current condition.    Baseline currently reported as funcitonal limitations (06/10/2021); states she is less limited and her left UE gives her less trouble but continues to bother her R elbow (07/28/2021);    Time 12    Period Weeks    Status Partially Met      PT LONG TERM GOAL #6   Title Reduce pain with functional activities to equal or less than 3/10 to allow patient to complete usual activities including ADLs, IADLs, work, and social engagement with less difficulty.    Baseline worst 8/10 (06/10/2021); 6/10 (0/24/2022);    Time 12    Period Weeks    Status Partially Met                   Plan - 07/28/21 1405     Clinical Impression Statement In person Spanish interpreter present and assisted with communication throughout the session. Patient has attended 10 physical therapy sessions this episode of care. She reports significant relief in cervical spine and upper trap region symptoms and improved her cervical rotation from 50 to 70-75 degrees each direction. She also is able to use her left UE  with less difficulty but remains limited by R UE pain and paresthesia when she uses it.  Patient appears to have participated well in HEP and reports decrease in worst pain (from 8/10 to 6/10). Continues to report paresthesia in bilateral hands and pain in R elbow. Patient is about to return to work and would benefit from continued PT during this time to help with stability of improvement and work towards improved activity tolerance and strength required to complete desired level of physical activity and functional activities. Plan to continue with PT for 5-10 visits as determined by patient's tolerance for increased loading and send back to referring clinician for further medical assessment if patient fails to continue improving. Patient would benefit from continued management of limiting condition by skilled physical therapist to address remaining impairments and functional limitations to work towards stated goals and return to PLOF or maximal functional independence.    Personal Factors and Comorbidities Comorbidity 3+;Social Background;Profession;Past/Current Experience;Time since onset of injury/illness/exacerbation;Fitness;Age    Comorbidities ARTHROSCOPIC ROTATOR CUFF REPAIR Right 10/19/11, hyperlipidemia, overweight, severe B CTS, hx of low back pain with radiculopathy    Examination-Activity Limitations Lift;Hygiene/Grooming;Caring for Others;Reach Overhead;Carry;Sleep    Examination-Participation Restrictions Community Activity;Occupation;Cleaning;Interpersonal Relationship;Other    Stability/Clinical Decision Making Evolving/Moderate complexity    Rehab Potential Fair    PT Duration 12 weeks    PT Treatment/Interventions ADLs/Self Care Home Management;Aquatic Therapy;Cryotherapy;Moist Heat;Electrical Stimulation;Therapeutic activities;Therapeutic exercise;Neuromuscular re-education;Manual techniques;Dry needling;Spinal Manipulations;Joint Manipulations;Passive range of motion    PT Next Visit Plan update HEP as appropriate, manual therapy as needed, postural strengthning    PT Home  Exercise Plan Access Code: [email or web address]    Consulted and Agree with Plan of Care Patient             Patient will benefit from skilled therapeutic intervention in order to improve the following deficits and impairments:  Improper body mechanics, Pain, Postural dysfunction, Decreased activity tolerance, Decreased endurance, Decreased range of motion, Decreased strength, Hypomobility, Impaired perceived functional ability, Impaired UE functional use, Impaired sensation  Visit Diagnosis: Cervicalgia  Radiculopathy, cervical region  Muscle weakness (generalized)  Pain in right elbow     Problem List There are no problems to display for this patient.  Everlean Alstrom. Graylon Good, PT, DPT 07/28/21, 2:06 PM   [facility] [address] [place], [address] Phone: [phone]   Fax:  [phone]  Name: Jessica Holden MRN: [id number] Date of Birth: 29-Dec-1960

## 2021-07-31 ENCOUNTER — Ambulatory Visit: Payer: BLUE CROSS/BLUE SHIELD | Admitting: Physical Therapy

## 2021-07-31 DIAGNOSIS — M5412 Radiculopathy, cervical region: Secondary | ICD-10-CM

## 2021-07-31 DIAGNOSIS — M25521 Pain in right elbow: Secondary | ICD-10-CM

## 2021-07-31 DIAGNOSIS — M6281 Muscle weakness (generalized): Secondary | ICD-10-CM

## 2021-07-31 DIAGNOSIS — M542 Cervicalgia: Secondary | ICD-10-CM | POA: Diagnosis not present

## 2021-07-31 NOTE — Therapy (Signed)
[facility] [address] [place], [address] Phone: [phone]   Fax:  [phone]  Physical Therapy Treatment  Patient Details  Name: Jessica Holden MRN: [id number] Date of Birth: 10-18-1960 Referring Provider (PT): Girtha Hake, MD   Encounter Date: 07/31/2021   PT End of Session - 07/31/21 1033     Visit Number 11    Number of Visits 24    Date for PT Re-Evaluation 09/02/21    Authorization Type [employer] reporting period from 10/24    Authorization - Visit Number 10    Authorization - Number of Visits 30    Progress Note Due on Visit 10    PT Start Time 0948    PT Stop Time 1033    PT Time Calculation (min) 45 min    Activity Tolerance Patient tolerated treatment well    Behavior During Therapy [facility] for tasks assessed/performed             Past Medical History:  Diagnosis Date   Hyperlipidemia     Past Surgical History:  Procedure Laterality Date   ARTHROSCOPIC ROTATOR CUFF REPAIR Right 10/19/2011    There were no vitals filed for this visit.   Subjective Assessment - 07/31/21 0950     Subjective Patient accompanied by in person Spanish interpreter who assisted with communication throughout session.  Patient reprots she is feeling a little better. She has been doing the exercises and placing the thing on her right arm which is a compression garment she got from her husband. States she does not have pain at rest but does bother her a bit when she moves her right arm. She does state she has pulsing at her left shoulder and feels a little tight. This goes down her left arm. She had numbness in the left hand when she awoke this morning but right now it is okay.    Patient is accompained by: Interpreter    Pertinent History Patient is a 60 y.o. female who presents to outpatient physical therapy with a referral for medical diagnosis cervicalgia and cervical radiculopathy.  This patient's chief complaints consist of constant lower neck pain and B UE paresthesia with intermittent headaches and pain to B hands and down back. Also complains of intermittent R sided LE numbness and R elbow pain and feeling it is "dislocated." Upper quarter, neck, head complaints lead to the following functional deficits: Difficulty with activities that require use of B UE including ADLs, working, reaching, lifting, carrying, sleeping, sitting. Relevant past medical history and comorbidities include ARTHROSCOPIC ROTATOR CUFF REPAIR Right 10/19/11, hyperlipidemia, overweight, severe B CTS, hx of low back pain with radicular symptoms.   Patient denies hx of cancer, stroke, spinal surgeries, lung problems, HTN, heart problems, diabetes, unexplained weight loss. Doe note some urinary incontinence and feeling of something in her vagina that has not changed with neck symptoms.    Limitations Sitting;Lifting;House hold activities;Other (comment)    Diagnostic tests States she had a nerve conduction study at the wrists but she stopped them from doing the neck. Results not available to PT at this time.    Patient Stated Goals would like to improve her problems in her neck and arms by the time she needs to go back to work.    Currently in Pain? No/denies              TREATMENT:  Manual therapy: to reduce pain and tissue tension, improve range  of motion, neuromodulation, in order to promote improved ability to complete functional activities. HOOKLYING - STM to posterior cervical spine focusing on bilateral suboccipital region, cervical paraspinals, levator scapulae, and upper traps. - gentle manual cervical spine distraction  - STM to bilateral posterior rotator cuff (concordant pain down right arm)  Therapeutic exercise: to centralize symptoms and improve ROM, strength, muscular endurance, and activity tolerance required for successful completion of functional activities.   - Standing cervical  thoracic extension/BUE flexion and serratus anterior activation, lat stretch, with foam roller up wall, 5 second holds, 1x10  Circuit: - standing scapular row with 15# cable, 2x10 - seated overhead press with 3# DB each hand, 2x10 (reports feels better than curl, noted for incomplete ROM overhead).   Circuit:  - seated lat pull, 2x10 at 15/20# - seated chest press, 2x10 at 5#.  - provided blue theraband to increase resistance for HEP rows.    Pt required multimodal cuing for proper technique and to facilitate improved neuromuscular control, strength, range of motion, and functional ability resulting in improved performance and form.   HOME EXERCISE PROGRAM Access Code: [email or web address] URL: [email or web address] Date: 07/23/2021 Prepared by: Rosita Kea   Exercises Supine Chin Tuck - 1 x daily - 1 sets - 20 reps - 5 seconds hold Supine Deep Neck Flexor Training - Repetitions - 2 sets - 10 reps - 2 seconds hold Prone W Scapular Retraction - 2 sets - 10 reps - 2-5 seconds hold Prone Angels - 1 x daily - 3 sets - 10 reps Sidelying Thoracic Rotation with Open Book - 1-2 x daily - 1 sets - 20 reps - 5 seconds hold Wrist Tendon Gliding - 3 x daily - 1 sets - 10 reps Row with band/cable - 1 x daily - 3 sets - 10 reps     PT Education - 07/31/21 1033     Education Details exercise/intervention purpose/form    Person(s) Educated Patient    Methods Explanation;Demonstration;Tactile cues;Verbal cues    Comprehension Verbalized understanding;Returned demonstration;Tactile cues required;Verbal cues required;Need further instruction              PT Short Term Goals - 06/16/21 1244       PT SHORT TERM GOAL #1   Title Be independent with initial home exercise program for self-management of symptoms.    Baseline Initial HEP to be provided at visit 2 as appropriate (06/10/2021);    Time 2    Period Weeks    Status Achieved    Target Date 06/25/21               PT Long  Term Goals - 07/28/21 0933       PT LONG TERM GOAL #1   Title Be independent with a long-term home exercise program for self-management of symptoms.    Baseline Initial HEP to be provided at visit 2 as appropriate (06/10/2021); currently participating in appropriate HEP (07/28/2021);    Time 12    Period Weeks    Status Partially Met   TARGET DATE FOR ALL LONG TERM GOALS: 09/02/2021     PT LONG TERM GOAL #2   Title Demonstrate improved FOTO score by 10 units to demonstrate improvement in overall condition and self-reported functional ability.    Baseline to be measured visit 2 as appropriate (06/10/2021); 51 at visit 3 (06/16/2021); 53 (07/28/2021);    Time 12    Period Weeks    Status Partially Met      PT LONG  TERM GOAL #3   Title Have full bilateral shoulder AROM with no compensations or increase in pain in all planes except intermittent end range discomfort to allow patient to complete valued activities such as reaching with less difficulty.    Baseline overhead ROM limited - see objective (06/11/2021); continues to be limited but is better than before - see objective (07/28/2021);    Time 12    Period Weeks    Status Partially Met      PT LONG TERM GOAL #4   Title Improve B UE strength to equal or greater than 4+/5 with no increase in pain to improve patient's ability to complete  valued functional tasks working, lifting, carrying, reaching with less difficulty.    Baseline Ranges from 3/5 to 4+/5 and increases pain - see objective (06/10/2021); most improved except shoulder abduction - continues to be painful and limited (07/28/2021);    Time 12    Period Weeks    Status Partially Met      PT LONG TERM GOAL #5   Title Complete community, work and/or recreational activities such as those that require use of B UE including ADLs, working, reaching, lifting, carrying, sleeping, sitting without limitation due to current condition.    Baseline currently reported as funcitonal limitations  (06/10/2021); states she is less limited and her left UE gives her less trouble but continues to bother her R elbow (07/28/2021);    Time 12    Period Weeks    Status Partially Met      PT LONG TERM GOAL #6   Title Reduce pain with functional activities to equal or less than 3/10 to allow patient to complete usual activities including ADLs, IADLs, work, and social engagement with less difficulty.    Baseline worst 8/10 (06/10/2021); 6/10 (0/24/2022);    Time 12    Period Weeks    Status Partially Met                   Plan - 07/31/21 1032     Clinical Impression Statement In person Spanish interpreter present and assisted with communication throughout the session. Patient tolerated treatment well overall and was able to advance to more upper quarter and postural strengthening. States it feels good to do strengthening at the machine. Continues to report pain and paresthesia that interrupts her quality of life and functional activity tolerance. Patient would benefit from continued management of limiting condition by skilled physical therapist to address remaining impairments and functional limitations to work towards stated goals and return to PLOF or maximal functional independence.    Personal Factors and Comorbidities Comorbidity 3+;Social Background;Profession;Past/Current Experience;Time since onset of injury/illness/exacerbation;Fitness;Age    Comorbidities ARTHROSCOPIC ROTATOR CUFF REPAIR Right 10/19/11, hyperlipidemia, overweight, severe B CTS, hx of low back pain with radiculopathy    Examination-Activity Limitations Lift;Hygiene/Grooming;Caring for Others;Reach Overhead;Carry;Sleep    Examination-Participation Restrictions Community Activity;Occupation;Cleaning;Interpersonal Relationship;Other    Stability/Clinical Decision Making Evolving/Moderate complexity    Rehab Potential Fair    PT Duration 12 weeks    PT Treatment/Interventions ADLs/Self Care Home Management;Aquatic  Therapy;Cryotherapy;Moist Heat;Electrical Stimulation;Therapeutic activities;Therapeutic exercise;Neuromuscular re-education;Manual techniques;Dry needling;Spinal Manipulations;Joint Manipulations;Passive range of motion    PT Next Visit Plan update HEP as appropriate, manual therapy as needed, postural strengthning    PT Home Exercise Plan Access Code: [email or web address]    Consulted and Agree with Plan of Care Patient             Patient will benefit from skilled therapeutic intervention in order to  improve the following deficits and impairments:  Improper body mechanics, Pain, Postural dysfunction, Decreased activity tolerance, Decreased endurance, Decreased range of motion, Decreased strength, Hypomobility, Impaired perceived functional ability, Impaired UE functional use, Impaired sensation  Visit Diagnosis: Cervicalgia  Radiculopathy, cervical region  Muscle weakness (generalized)  Pain in right elbow     Problem List There are no problems to display for this patient.   Everlean Alstrom. Graylon Good, PT, DPT 07/31/21, 10:51 AM   [facility] [address], [place], [address] Phone: [phone]   Fax:  [phone]  Name: Iyonna Rish MRN: [id number] Date of Birth: Mar 19, 1961

## 2021-08-04 ENCOUNTER — Encounter: Payer: Self-pay | Admitting: Physical Therapy

## 2021-08-04 ENCOUNTER — Ambulatory Visit: Payer: BLUE CROSS/BLUE SHIELD | Admitting: Physical Therapy

## 2021-08-04 DIAGNOSIS — M25521 Pain in right elbow: Secondary | ICD-10-CM

## 2021-08-04 DIAGNOSIS — M542 Cervicalgia: Secondary | ICD-10-CM | POA: Diagnosis not present

## 2021-08-04 DIAGNOSIS — M6281 Muscle weakness (generalized): Secondary | ICD-10-CM

## 2021-08-04 DIAGNOSIS — M5412 Radiculopathy, cervical region: Secondary | ICD-10-CM

## 2021-08-04 NOTE — Therapy (Signed)
[facility] [address], [place], [address] Phone: [phone]   Fax:  [phone]  Physical Therapy Treatment  Patient Details  Name: Jessica Holden MRN: [id number] Date of Birth: 12-06-1960 Referring Provider (PT): Girtha Hake, MD   Encounter Date: 08/04/2021   PT End of Session - 08/04/21 1056     Visit Number 12    Number of Visits 24    Date for PT Re-Evaluation 09/02/21    Authorization Type [employer] reporting period from 10/24    Authorization - Visit Number 10    Authorization - Number of Visits 30    Progress Note Due on Visit 10    PT Start Time 1607    PT Stop Time 1113    PT Time Calculation (min) 38 min    Activity Tolerance Patient tolerated treatment well    Behavior During Therapy [facility] for tasks assessed/performed             Past Medical History:  Diagnosis Date   Hyperlipidemia     Past Surgical History:  Procedure Laterality Date   ARTHROSCOPIC ROTATOR CUFF REPAIR Right 10/19/2011    There were no vitals filed for this visit.   Subjective Assessment - 08/04/21 1035     Subjective Patient accompanied by in person Spanish interpreter who assisted with communication throughout session. Pateint reports the left arm continues to be numb and the finger is asleep. Had some R elbow pain last night but it improved when she put some cream on it that her son bought for her. Patient reports she felt good after last PT session and the new exercises helped her out. Patient reports curent pain is not present but just a heavy feeling in R > L UE. States she is returning to work this evening.    Patient is accompained by: Interpreter    Pertinent History Patient is a 60 y.o. female who presents to outpatient physical therapy with a referral for medical diagnosis cervicalgia and cervical radiculopathy. This patient's chief complaints consist of constant lower neck pain  and B UE paresthesia with intermittent headaches and pain to B hands and down back. Also complains of intermittent R sided LE numbness and R elbow pain and feeling it is "dislocated." Upper quarter, neck, head complaints lead to the following functional deficits: Difficulty with activities that require use of B UE including ADLs, working, reaching, lifting, carrying, sleeping, sitting. Relevant past medical history and comorbidities include ARTHROSCOPIC ROTATOR CUFF REPAIR Right 10/19/11, hyperlipidemia, overweight, severe B CTS, hx of low back pain with radicular symptoms.   Patient denies hx of cancer, stroke, spinal surgeries, lung problems, HTN, heart problems, diabetes, unexplained weight loss. Doe note some urinary incontinence and feeling of something in her vagina that has not changed with neck symptoms.    Limitations Sitting;Lifting;House hold activities;Other (comment)    Diagnostic tests States she had a nerve conduction study at the wrists but she stopped them from doing the neck. Results not available to PT at this time.    Patient Stated Goals would like to improve her problems in her neck and arms by the time she needs to go back to work.    Currently in Pain? Yes    Pain Location --   heaviness            TREATMENT:  Manual therapy: to reduce pain and tissue tension, improve range of motion, neuromodulation, in order to promote  improved ability to complete functional activities. HOOKLYING - STM to posterior cervical spine focusing on bilateral suboccipital region, cervical paraspinals, levator scapulae, and upper traps. - gentle manual cervical spine distraction  - STM to bilateral posterior rotator cuff (concordant pain down right arm)   Therapeutic exercise: to centralize symptoms and improve ROM, strength, muscular endurance, and activity tolerance required for successful completion of functional activities.    - Standing cervical thoracic extension/BUE flexion and serratus  anterior activation, lat stretch, with foam roller up wall, 5 second holds, 1x20   Circuit: - standing scapular row with 20# cable, 2x10 (rated medium difficulty) - seated overhead press with 4# DB each hand, 2x10 (states she can feel it a lot at left arm when asked to rate difficulty level).    Circuit:  - seated lat pull, 2x10 at 25# (rates hard) - seated chest press, 2x10 at 10#. (Rates hard for left anterior arm).   Pt required multimodal cuing for proper technique and to facilitate improved neuromuscular control, strength, range of motion, and functional ability resulting in improved performance and form.   HOME EXERCISE PROGRAM Access Code: [email or web address] URL: [email or web address] Date: 07/23/2021 Prepared by: Rosita Kea   Exercises Supine Chin Tuck - 1 x daily - 1 sets - 20 reps - 5 seconds hold Supine Deep Neck Flexor Training - Repetitions - 2 sets - 10 reps - 2 seconds hold Prone W Scapular Retraction - 2 sets - 10 reps - 2-5 seconds hold Prone Angels - 1 x daily - 3 sets - 10 reps Sidelying Thoracic Rotation with Open Book - 1-2 x daily - 1 sets - 20 reps - 5 seconds hold Wrist Tendon Gliding - 3 x daily - 1 sets - 10 reps Row with band/cable - 1 x daily - 3 sets - 10 reps     PT Education - 08/04/21 1056     Education Details exercise/intervention purpose/form    Person(s) Educated Patient    Methods Explanation;Demonstration;Tactile cues;Verbal cues    Comprehension Verbalized understanding;Returned demonstration;Tactile cues required;Verbal cues required;Need further instruction              PT Short Term Goals - 06/16/21 1244       PT SHORT TERM GOAL #1   Title Be independent with initial home exercise program for self-management of symptoms.    Baseline Initial HEP to be provided at visit 2 as appropriate (06/10/2021);    Time 2    Period Weeks    Status Achieved    Target Date 06/25/21               PT Long Term Goals - 07/28/21 0933        PT LONG TERM GOAL #1   Title Be independent with a long-term home exercise program for self-management of symptoms.    Baseline Initial HEP to be provided at visit 2 as appropriate (06/10/2021); currently participating in appropriate HEP (07/28/2021);    Time 12    Period Weeks    Status Partially Met   TARGET DATE FOR ALL LONG TERM GOALS: 09/02/2021     PT LONG TERM GOAL #2   Title Demonstrate improved FOTO score by 10 units to demonstrate improvement in overall condition and self-reported functional ability.    Baseline to be measured visit 2 as appropriate (06/10/2021); 51 at visit 3 (06/16/2021); 53 (07/28/2021);    Time 12    Period Weeks    Status Partially Met  PT LONG TERM GOAL #3   Title Have full bilateral shoulder AROM with no compensations or increase in pain in all planes except intermittent end range discomfort to allow patient to complete valued activities such as reaching with less difficulty.    Baseline overhead ROM limited - see objective (06/11/2021); continues to be limited but is better than before - see objective (07/28/2021);    Time 12    Period Weeks    Status Partially Met      PT LONG TERM GOAL #4   Title Improve B UE strength to equal or greater than 4+/5 with no increase in pain to improve patient's ability to complete  valued functional tasks working, lifting, carrying, reaching with less difficulty.    Baseline Ranges from 3/5 to 4+/5 and increases pain - see objective (06/10/2021); most improved except shoulder abduction - continues to be painful and limited (07/28/2021);    Time 12    Period Weeks    Status Partially Met      PT LONG TERM GOAL #5   Title Complete community, work and/or recreational activities such as those that require use of B UE including ADLs, working, reaching, lifting, carrying, sleeping, sitting without limitation due to current condition.    Baseline currently reported as funcitonal limitations (06/10/2021); states she is less  limited and her left UE gives her less trouble but continues to bother her R elbow (07/28/2021);    Time 12    Period Weeks    Status Partially Met      PT LONG TERM GOAL #6   Title Reduce pain with functional activities to equal or less than 3/10 to allow patient to complete usual activities including ADLs, IADLs, work, and social engagement with less difficulty.    Baseline worst 8/10 (06/10/2021); 6/10 (0/24/2022);    Time 12    Period Weeks    Status Partially Met                   Plan - 08/04/21 1101     Clinical Impression Statement In person Spanish interpreter present and assisted with communication throughout the session. Patient tolerated treatment well overall and was able to progress to increased resistance with several exercises. Continues to report paresthesia in bilateral hands and arms and pressure at bilateral posterior RTC reproduce pain in this area and down the arm. Patient would benefit from continued management of limiting condition by skilled physical therapist to address remaining impairments and functional limitations to work towards stated goals and return to PLOF or maximal functional independence.    Personal Factors and Comorbidities Comorbidity 3+;Social Background;Profession;Past/Current Experience;Time since onset of injury/illness/exacerbation;Fitness;Age    Comorbidities ARTHROSCOPIC ROTATOR CUFF REPAIR Right 10/19/11, hyperlipidemia, overweight, severe B CTS, hx of low back pain with radiculopathy    Examination-Activity Limitations Lift;Hygiene/Grooming;Caring for Others;Reach Overhead;Carry;Sleep    Examination-Participation Restrictions Community Activity;Occupation;Cleaning;Interpersonal Relationship;Other    Stability/Clinical Decision Making Evolving/Moderate complexity    Rehab Potential Fair    PT Duration 12 weeks    PT Treatment/Interventions ADLs/Self Care Home Management;Aquatic Therapy;Cryotherapy;Moist Heat;Electrical  Stimulation;Therapeutic activities;Therapeutic exercise;Neuromuscular re-education;Manual techniques;Dry needling;Spinal Manipulations;Joint Manipulations;Passive range of motion    PT Next Visit Plan update HEP as appropriate, manual therapy as needed, postural strengthning    PT Home Exercise Plan Access Code: [email or web address]    Consulted and Agree with Plan of Care Patient             Patient will benefit from skilled therapeutic intervention in order to improve  the following deficits and impairments:  Improper body mechanics, Pain, Postural dysfunction, Decreased activity tolerance, Decreased endurance, Decreased range of motion, Decreased strength, Hypomobility, Impaired perceived functional ability, Impaired UE functional use, Impaired sensation  Visit Diagnosis: Cervicalgia  Radiculopathy, cervical region  Muscle weakness (generalized)  Pain in right elbow     Problem List There are no problems to display for this patient.   Everlean Alstrom. Graylon Good, PT, DPT 08/04/21, 11:13 AM   [facility] [address], [place], [address] Phone: [phone]   Fax:  [phone]  Name: Jessica Holden MRN: [id number] Date of Birth: June 27, 1961

## 2021-08-07 ENCOUNTER — Ambulatory Visit: Payer: BLUE CROSS/BLUE SHIELD | Attending: Physical Medicine & Rehabilitation | Admitting: Physical Therapy

## 2021-08-07 DIAGNOSIS — M6281 Muscle weakness (generalized): Secondary | ICD-10-CM | POA: Diagnosis present

## 2021-08-07 DIAGNOSIS — M5412 Radiculopathy, cervical region: Secondary | ICD-10-CM | POA: Diagnosis present

## 2021-08-07 DIAGNOSIS — M542 Cervicalgia: Secondary | ICD-10-CM | POA: Diagnosis present

## 2021-08-07 DIAGNOSIS — M25521 Pain in right elbow: Secondary | ICD-10-CM | POA: Insufficient documentation

## 2021-08-07 NOTE — Therapy (Signed)
[facility] [address], [place], [address] Phone: [phone]   Fax:  [phone]  Physical Therapy Treatment  Patient Details  Name: Jessica Holden MRN: [id number] Date of Birth: Feb 12, 1961 Referring Provider (PT): Girtha Hake, MD   Encounter Date: 08/07/2021   PT End of Session - 08/07/21 0936     Visit Number 13    Number of Visits 24    Date for PT Re-Evaluation 09/02/21    Authorization Type [employer] reporting period from 10/24    Authorization - Visit Number 10    Authorization - Number of Visits 30    Progress Note Due on Visit 10    PT Start Time 0903    PT Stop Time 0943    PT Time Calculation (min) 40 min    Activity Tolerance Patient tolerated treatment well    Behavior During Therapy [facility] for tasks assessed/performed             Past Medical History:  Diagnosis Date   Hyperlipidemia     Past Surgical History:  Procedure Laterality Date   ARTHROSCOPIC ROTATOR CUFF REPAIR Right 10/19/2011    There were no vitals filed for this visit.   Subjective Assessment - 08/07/21 0908     Subjective Patient accompanied by in person Spanish interpreter who assisted with communication throughout session. State her arms hurt and she is tired from working. The point of most pain is near her R elbow and she rates it 5/10. She took some tylenol yesterday and is using some oil/ointmnet that her son got her that has helped some. She reports she has noticed although she has some pain/discomfort with working it is not as bad as it was prior to coming to PT. R  arm has been tensing up a little bit and the left hand "goes to sleep" from the elbow to the fingers and over her left shoulder as a pulling all the way down to her thumb. She needs to drop her hand occasionally to help with this. She could not feel her left hand during work but when she stopped working the numbness went away but  her hand felt really tired and it just felt really stiff. Before she came to PT this happened in both hands a lot.    Patient is accompained by: Interpreter    Pertinent History Patient is a 60 y.o. female who presents to outpatient physical therapy with a referral for medical diagnosis cervicalgia and cervical radiculopathy. This patient's chief complaints consist of constant lower neck pain and B UE paresthesia with intermittent headaches and pain to B hands and down back. Also complains of intermittent R sided LE numbness and R elbow pain and feeling it is "dislocated." Upper quarter, neck, head complaints lead to the following functional deficits: Difficulty with activities that require use of B UE including ADLs, working, reaching, lifting, carrying, sleeping, sitting. Relevant past medical history and comorbidities include ARTHROSCOPIC ROTATOR CUFF REPAIR Right 10/19/11, hyperlipidemia, overweight, severe B CTS, hx of low back pain with radicular symptoms.   Patient denies hx of cancer, stroke, spinal surgeries, lung problems, HTN, heart problems, diabetes, unexplained weight loss. Doe note some urinary incontinence and feeling of something in her vagina that has not changed with neck symptoms.    Limitations Sitting;Lifting;House hold activities;Other (comment)    Diagnostic tests States she had a nerve conduction study at the wrists but she stopped them from doing the  neck. Results not available to PT at this time.    Patient Stated Goals would like to improve her problems in her neck and arms by the time she needs to go back to work.    Currently in Pain? Yes    Pain Score 5                 TREATMENT:  Manual therapy: to reduce pain and tissue tension, improve range of motion, neuromodulation, in order to promote improved ability to complete functional activities. HOOKLYING - STM to posterior cervical spine focusing on bilateral suboccipital region, cervical paraspinals, levator scapulae,  and upper traps. - gentle manual cervical spine distraction  - STM to bilateral posterior rotator cuff (concordant pain down right arm)   Therapeutic exercise: to centralize symptoms and improve ROM, strength, muscular endurance, and activity tolerance required for successful completion of functional activities.    - Standing cervical thoracic extension/BUE flexion and serratus anterior activation, lat stretch, with foam roller up wall, 5 second holds, 1x20   Circuit: - standing scapular row with 20# cable, 2x10 (rated medium difficulty) - seated overhead press with 4# DB each hand, 2x10 (states she can feel it a lot at left arm when asked to rate difficulty level).    Circuit:  - seated lat pull, 2x10 at 25# (rates hard) - seated chest press, 2x10 at 10#. (Rates hard for left anterior arm).   Pt required multimodal cuing for proper technique and to facilitate improved neuromuscular control, strength, range of motion, and functional ability resulting in improved performance and form.   HOME EXERCISE PROGRAM Access Code: [email or web address] URL: [email or web address] Date: 07/23/2021 Prepared by: Rosita Kea   Exercises Supine Chin Tuck - 1 x daily - 1 sets - 20 reps - 5 seconds hold Supine Deep Neck Flexor Training - Repetitions - 2 sets - 10 reps - 2 seconds hold Prone W Scapular Retraction - 2 sets - 10 reps - 2-5 seconds hold Prone Angels - 1 x daily - 3 sets - 10 reps Sidelying Thoracic Rotation with Open Book - 1-2 x daily - 1 sets - 20 reps - 5 seconds hold Wrist Tendon Gliding - 3 x daily - 1 sets - 10 reps Row with band/cable - 1 x daily - 3 sets - 10 reps     PT Education - 08/07/21 0935     Education Details exercise/intervention purpose/form    Person(s) Educated Patient    Methods Explanation;Demonstration;Tactile cues;Verbal cues    Comprehension Verbalized understanding;Returned demonstration;Verbal cues required;Tactile cues required;Need further instruction               PT Short Term Goals - 06/16/21 1244       PT SHORT TERM GOAL #1   Title Be independent with initial home exercise program for self-management of symptoms.    Baseline Initial HEP to be provided at visit 2 as appropriate (06/10/2021);    Time 2    Period Weeks    Status Achieved    Target Date 06/25/21               PT Long Term Goals - 07/28/21 0933       PT LONG TERM GOAL #1   Title Be independent with a long-term home exercise program for self-management of symptoms.    Baseline Initial HEP to be provided at visit 2 as appropriate (06/10/2021); currently participating in appropriate HEP (07/28/2021);    Time 12    Period Weeks  Status Partially Met   TARGET DATE FOR ALL LONG TERM GOALS: 09/02/2021     PT LONG TERM GOAL #2   Title Demonstrate improved FOTO score by 10 units to demonstrate improvement in overall condition and self-reported functional ability.    Baseline to be measured visit 2 as appropriate (06/10/2021); 51 at visit 3 (06/16/2021); 53 (07/28/2021);    Time 12    Period Weeks    Status Partially Met      PT LONG TERM GOAL #3   Title Have full bilateral shoulder AROM with no compensations or increase in pain in all planes except intermittent end range discomfort to allow patient to complete valued activities such as reaching with less difficulty.    Baseline overhead ROM limited - see objective (06/11/2021); continues to be limited but is better than before - see objective (07/28/2021);    Time 12    Period Weeks    Status Partially Met      PT LONG TERM GOAL #4   Title Improve B UE strength to equal or greater than 4+/5 with no increase in pain to improve patient's ability to complete  valued functional tasks working, lifting, carrying, reaching with less difficulty.    Baseline Ranges from 3/5 to 4+/5 and increases pain - see objective (06/10/2021); most improved except shoulder abduction - continues to be painful and limited (07/28/2021);     Time 12    Period Weeks    Status Partially Met      PT LONG TERM GOAL #5   Title Complete community, work and/or recreational activities such as those that require use of B UE including ADLs, working, reaching, lifting, carrying, sleeping, sitting without limitation due to current condition.    Baseline currently reported as funcitonal limitations (06/10/2021); states she is less limited and her left UE gives her less trouble but continues to bother her R elbow (07/28/2021);    Time 12    Period Weeks    Status Partially Met      PT LONG TERM GOAL #6   Title Reduce pain with functional activities to equal or less than 3/10 to allow patient to complete usual activities including ADLs, IADLs, work, and social engagement with less difficulty.    Baseline worst 8/10 (06/10/2021); 6/10 (0/24/2022);    Time 12    Period Weeks    Status Partially Met                   Plan - 08/07/21 0934     Clinical Impression Statement In person Spanish interpreter present and assisted with communication throughout the session. Patient tolerated treatment well overall but did have some increased soreness after returning to work this week. Continued with similar interventions and did not progress exercise due to added physical load of returning to work. Did have some additional pain with overhead movements of R UE that did not worsen with additional reps.Patient would benefit from continued management of limiting condition by skilled physical therapist to address remaining impairments and functional limitations to work towards stated goals and return to PLOF or maximal functional independence.    Personal Factors and Comorbidities Comorbidity 3+;Social Background;Profession;Past/Current Experience;Time since onset of injury/illness/exacerbation;Fitness;Age    Comorbidities ARTHROSCOPIC ROTATOR CUFF REPAIR Right 10/19/11, hyperlipidemia, overweight, severe B CTS, hx of low back pain with radiculopathy     Examination-Activity Limitations Lift;Hygiene/Grooming;Caring for Others;Reach Overhead;Carry;Sleep    Examination-Participation Restrictions Community Activity;Occupation;Cleaning;Interpersonal Relationship;Other    Stability/Clinical Decision Making Evolving/Moderate complexity  Rehab Potential Fair    PT Duration 12 weeks    PT Treatment/Interventions ADLs/Self Care Home Management;Aquatic Therapy;Cryotherapy;Moist Heat;Electrical Stimulation;Therapeutic activities;Therapeutic exercise;Neuromuscular re-education;Manual techniques;Dry needling;Spinal Manipulations;Joint Manipulations;Passive range of motion    PT Next Visit Plan update HEP as appropriate, manual therapy as needed, postural strengthning    PT Home Exercise Plan Access Code: [email or web address]    Consulted and Agree with Plan of Care Patient             Patient will benefit from skilled therapeutic intervention in order to improve the following deficits and impairments:  Improper body mechanics, Pain, Postural dysfunction, Decreased activity tolerance, Decreased endurance, Decreased range of motion, Decreased strength, Hypomobility, Impaired perceived functional ability, Impaired UE functional use, Impaired sensation  Visit Diagnosis: Cervicalgia  Radiculopathy, cervical region  Muscle weakness (generalized)  Pain in right elbow     Problem List There are no problems to display for this patient.  Everlean Alstrom. Graylon Good, PT, DPT 08/07/21, 9:38 AM   [facility] [address], [place], [address] Phone: [phone]   Fax:  [phone]  Name: Tattianna Schnarr MRN: [id number] Date of Birth: May 31, 1961

## 2021-08-12 ENCOUNTER — Ambulatory Visit: Payer: BLUE CROSS/BLUE SHIELD | Admitting: Physical Therapy

## 2021-08-12 ENCOUNTER — Encounter: Payer: Self-pay | Admitting: Physical Therapy

## 2021-08-12 DIAGNOSIS — M6281 Muscle weakness (generalized): Secondary | ICD-10-CM

## 2021-08-12 DIAGNOSIS — M542 Cervicalgia: Secondary | ICD-10-CM | POA: Diagnosis not present

## 2021-08-12 DIAGNOSIS — M25521 Pain in right elbow: Secondary | ICD-10-CM

## 2021-08-12 DIAGNOSIS — M5412 Radiculopathy, cervical region: Secondary | ICD-10-CM

## 2021-08-12 NOTE — Therapy (Signed)
[facility] [address] [place], [address] Phone: [phone]   Fax:  [phone]  Physical Therapy Treatment  Patient Details  Name: Jessica Holden MRN: [id number] Date of Birth: 1961-09-09 Referring Provider (PT): Girtha Hake, MD   Encounter Date: 08/12/2021   PT End of Session - 08/12/21 0946     Visit Number 14    Number of Visits 24    Date for PT Re-Evaluation 09/02/21    Authorization Type [employer] reporting period from 10/24    Authorization - Visit Number 10    Authorization - Number of Visits 30    Progress Note Due on Visit 20    PT Start Time 0902    PT Stop Time 0945    PT Time Calculation (min) 43 min    Activity Tolerance Patient tolerated treatment well    Behavior During Therapy [facility] for tasks assessed/performed             Past Medical History:  Diagnosis Date   Hyperlipidemia     Past Surgical History:  Procedure Laterality Date   ARTHROSCOPIC ROTATOR CUFF REPAIR Right 10/19/2011    There were no vitals filed for this visit.   Subjective Assessment - 08/12/21 0904     Subjective Patient accompanied by in person Spanish interpreter who assisted with communication throughout session. Patient reports she continues to be better since starting PT but worse since going back to work. Friday and Saturday are her days off so she went back to work this week on Sunday evening. States before PT she could hardly use her right hand but now she can use it. Her left hand was cramping last night at work. Reports she doesn't feel "really pain" so it is very very low, maybe 1/10 in the right elbow and numbness from the distal L forearm to the entire hand and it feels cramping. She felt better after she left last PT session.    Patient is accompained by: Interpreter    Pertinent History Patient is a 60 y.o. female who presents to outpatient physical therapy with a referral  for medical diagnosis cervicalgia and cervical radiculopathy. This patient's chief complaints consist of constant lower neck pain and B UE paresthesia with intermittent headaches and pain to B hands and down back. Also complains of intermittent R sided LE numbness and R elbow pain and feeling it is "dislocated." Upper quarter, neck, head complaints lead to the following functional deficits: Difficulty with activities that require use of B UE including ADLs, working, reaching, lifting, carrying, sleeping, sitting. Relevant past medical history and comorbidities include ARTHROSCOPIC ROTATOR CUFF REPAIR Right 10/19/11, hyperlipidemia, overweight, severe B CTS, hx of low back pain with radicular symptoms.   Patient denies hx of cancer, stroke, spinal surgeries, lung problems, HTN, heart problems, diabetes, unexplained weight loss. Doe note some urinary incontinence and feeling of something in her vagina that has not changed with neck symptoms.    Limitations Sitting;Lifting;House hold activities;Other (comment)    Diagnostic tests States she had a nerve conduction study at the wrists but she stopped them from doing the neck. Results not available to PT at this time.    Patient Stated Goals would like to improve her problems in her neck and arms by the time she needs to go back to work.    Currently in Pain? Yes    Pain Score 1  Nerve Conduction Study report 05/28/2021:  Impression: Abnormal study. There is electrodiagnostic evidence of chronic, severe bilateral carpal tunnel syndrome.   TREATMENT:  Manual therapy: to reduce pain and tissue tension, improve range of motion, neuromodulation, in order to promote improved ability to complete functional activities. HOOKLYING - STM to posterior cervical spine focusing on bilateral suboccipital region, cervical paraspinals, levator scapulae, and upper traps. - gentle manual cervical spine distraction  - STM to bilateral posterior rotator cuff  (concordant pain down right arm)   Therapeutic exercise: to centralize symptoms and improve ROM, strength, muscular endurance, and activity tolerance required for successful completion of functional activities.  - Standing cervical thoracic extension/BUE flexion and serratus anterior activation, lat stretch, with foam roller up wall, 5 second holds, 1x20 - standing scapular row with 20# cable, 1x10 (rated medium difficulty) - seated overhead press with 4# DB each hand, 1x10 (feels weak on both sides when she lifts that high).  - seated lat pull, 1x10 at 25# (rates hard) - seated chest press, 1x10 at 10#. (Rates hard).   Pt required multimodal cuing for proper technique and to facilitate improved neuromuscular control, strength, range of motion, and functional ability resulting in improved performance and form.   HOME EXERCISE PROGRAM Access Code: [email or web address] URL: [email or web address] Date: 07/23/2021 Prepared by: Rosita Kea   Exercises Supine Chin Tuck - 1 x daily - 1 sets - 20 reps - 5 seconds hold Supine Deep Neck Flexor Training - Repetitions - 2 sets - 10 reps - 2 seconds hold Prone W Scapular Retraction - 2 sets - 10 reps - 2-5 seconds hold Prone Angels - 1 x daily - 3 sets - 10 reps Sidelying Thoracic Rotation with Open Book - 1-2 x daily - 1 sets - 20 reps - 5 seconds hold Wrist Tendon Gliding - 3 x daily - 1 sets - 10 reps Row with band/cable - 1 x daily - 3 sets - 10 reps     PT Education - 08/12/21 0946     Education Details exercise/intervention purpose/form    Person(s) Educated Patient    Methods Explanation;Demonstration;Tactile cues;Verbal cues    Comprehension Verbalized understanding;Returned demonstration;Verbal cues required;Tactile cues required;Need further instruction              PT Short Term Goals - 06/16/21 1244       PT SHORT TERM GOAL #1   Title Be independent with initial home exercise program for self-management of symptoms.     Baseline Initial HEP to be provided at visit 2 as appropriate (06/10/2021);    Time 2    Period Weeks    Status Achieved    Target Date 06/25/21               PT Long Term Goals - 07/28/21 0933       PT LONG TERM GOAL #1   Title Be independent with a long-term home exercise program for self-management of symptoms.    Baseline Initial HEP to be provided at visit 2 as appropriate (06/10/2021); currently participating in appropriate HEP (07/28/2021);    Time 12    Period Weeks    Status Partially Met   TARGET DATE FOR ALL LONG TERM GOALS: 09/02/2021     PT LONG TERM GOAL #2   Title Demonstrate improved FOTO score by 10 units to demonstrate improvement in overall condition and self-reported functional ability.    Baseline to be measured visit 2 as appropriate (06/10/2021); 51 at visit 3 (06/16/2021); 53 (  07/28/2021);    Time 12    Period Weeks    Status Partially Met      PT LONG TERM GOAL #3   Title Have full bilateral shoulder AROM with no compensations or increase in pain in all planes except intermittent end range discomfort to allow patient to complete valued activities such as reaching with less difficulty.    Baseline overhead ROM limited - see objective (06/11/2021); continues to be limited but is better than before - see objective (07/28/2021);    Time 12    Period Weeks    Status Partially Met      PT LONG TERM GOAL #4   Title Improve B UE strength to equal or greater than 4+/5 with no increase in pain to improve patient's ability to complete  valued functional tasks working, lifting, carrying, reaching with less difficulty.    Baseline Ranges from 3/5 to 4+/5 and increases pain - see objective (06/10/2021); most improved except shoulder abduction - continues to be painful and limited (07/28/2021);    Time 12    Period Weeks    Status Partially Met      PT LONG TERM GOAL #5   Title Complete community, work and/or recreational activities such as those that require use of B UE  including ADLs, working, reaching, lifting, carrying, sleeping, sitting without limitation due to current condition.    Baseline currently reported as funcitonal limitations (06/10/2021); states she is less limited and her left UE gives her less trouble but continues to bother her R elbow (07/28/2021);    Time 12    Period Weeks    Status Partially Met      PT LONG TERM GOAL #6   Title Reduce pain with functional activities to equal or less than 3/10 to allow patient to complete usual activities including ADLs, IADLs, work, and social engagement with less difficulty.    Baseline worst 8/10 (06/10/2021); 6/10 (0/24/2022);    Time 12    Period Weeks    Status Partially Met                   Plan - 08/12/21 0944     Clinical Impression Statement In person Spanish interpreter present and assisted with communication throughout the session. Pateint tolerated treatment well overall but continues to have numbness in the left UE and has had worsening symptoms with return to work. Patient may benefit from further medical evaluation for carpal tunnel syndrome. Patient continues to report relief from manual therapy and is tolerating strengthening well.Patient would benefit from continued management of limiting condition by skilled physical therapist to address remaining impairments and functional limitations to work towards stated goals and return to PLOF or maximal functional independence.    Personal Factors and Comorbidities Comorbidity 3+;Social Background;Profession;Past/Current Experience;Time since onset of injury/illness/exacerbation;Fitness;Age    Comorbidities ARTHROSCOPIC ROTATOR CUFF REPAIR Right 10/19/11, hyperlipidemia, overweight, severe B CTS, hx of low back pain with radiculopathy    Examination-Activity Limitations Lift;Hygiene/Grooming;Caring for Others;Reach Overhead;Carry;Sleep    Examination-Participation Restrictions Community Activity;Occupation;Cleaning;Interpersonal  Relationship;Other    Stability/Clinical Decision Making Evolving/Moderate complexity    Rehab Potential Fair    PT Duration 12 weeks    PT Treatment/Interventions ADLs/Self Care Home Management;Aquatic Therapy;Cryotherapy;Moist Heat;Electrical Stimulation;Therapeutic activities;Therapeutic exercise;Neuromuscular re-education;Manual techniques;Dry needling;Spinal Manipulations;Joint Manipulations;Passive range of motion    PT Next Visit Plan update HEP as appropriate, manual therapy as needed, postural strengthning    PT Home Exercise Plan Access Code: [email or web address]    Consulted and Agree  with Plan of Care Patient             Patient will benefit from skilled therapeutic intervention in order to improve the following deficits and impairments:  Improper body mechanics, Pain, Postural dysfunction, Decreased activity tolerance, Decreased endurance, Decreased range of motion, Decreased strength, Hypomobility, Impaired perceived functional ability, Impaired UE functional use, Impaired sensation  Visit Diagnosis: Cervicalgia  Radiculopathy, cervical region  Muscle weakness (generalized)  Pain in right elbow     Problem List There are no problems to display for this patient.   Everlean Alstrom. Graylon Good, PT, DPT 08/12/21, 9:47 AM   [facility] [address] [place], [address] Phone: [phone]   Fax:  [phone]  Name: Reiko Vinje MRN: [id number] Date of Birth: 1961-09-08

## 2021-08-14 ENCOUNTER — Encounter: Payer: Self-pay | Admitting: Physical Therapy

## 2021-08-14 ENCOUNTER — Ambulatory Visit: Payer: BLUE CROSS/BLUE SHIELD | Admitting: Physical Therapy

## 2021-08-14 DIAGNOSIS — M6281 Muscle weakness (generalized): Secondary | ICD-10-CM

## 2021-08-14 DIAGNOSIS — M542 Cervicalgia: Secondary | ICD-10-CM

## 2021-08-14 DIAGNOSIS — M25521 Pain in right elbow: Secondary | ICD-10-CM

## 2021-08-14 DIAGNOSIS — M5412 Radiculopathy, cervical region: Secondary | ICD-10-CM

## 2021-08-14 NOTE — Therapy (Signed)
[facility] [address], [place], [address] Phone: [phone]   Fax:  [phone]  Physical Therapy Treatment  Patient Details  Name: Jessica Holden MRN: [id number] Date of Birth: [birth year] Referring Provider (PT): Girtha Hake, MD   Encounter Date: 08/14/2021   PT End of Session - 08/14/21 1004     Visit Number 15    Number of Visits 24    Date for PT Re-Evaluation 09/02/21    Authorization Type [employer] reporting period from 07/28/21    Authorization - Visit Number 15    Authorization - Number of Visits 30    Progress Note Due on Visit 20    PT Start Time 0903    PT Stop Time 0945    PT Time Calculation (min) 42 min    Activity Tolerance Patient tolerated treatment well    Behavior During Therapy [facility] for tasks assessed/performed             Past Medical History:  Diagnosis Date   Hyperlipidemia     Past Surgical History:  Procedure Laterality Date   ARTHROSCOPIC ROTATOR CUFF REPAIR Right 10/19/2011    There were no vitals filed for this visit.   Subjective Assessment - 08/14/21 0904     Subjective Patient accompanied by in person Spanish interpreter who assisted with communication throughout session. Patient reports she is really tired. She only slept 1.5 hours last night. She states she continues to have a little pain in the elbows and her left hand goes to sleep while she is working, especially in the thumb. Either way she can still work better than before. Reports current pain is pretty low currently. When she was working it was about 5/10 in the right elbow region and at left thymb. She took two tylenols that helped. Her leg has been hurting a lot and she thinks it could be the sciatic nerve. She states her PCP told her that her leg pain could be connected to her neck pain. she would like to know if the PT has any advice on what she can do to help her leg/back feel  better. She feels worse when she puts weight on the foot after lying down, and when she picks up her 12.74 year old grandson she cannot sustain it. Denies pain with prolonged sitting. It hurts when she stands up afterwards. When she starts walking at first it works and then with movement it gets easier. She states her foot feels really heavy and tense. It is the right side. Denies back surgery.    Patient is accompained by: Interpreter    Pertinent History Patient is a 60 y.o. female who presents to outpatient physical therapy with a referral for medical diagnosis cervicalgia and cervical radiculopathy. This patient's chief complaints consist of constant lower neck pain and B UE paresthesia with intermittent headaches and pain to B hands and down back. Also complains of intermittent R sided LE numbness and R elbow pain and feeling it is "dislocated." Upper quarter, neck, head complaints lead to the following functional deficits: Difficulty with activities that require use of B UE including ADLs, working, reaching, lifting, carrying, sleeping, sitting. Relevant past medical history and comorbidities include ARTHROSCOPIC ROTATOR CUFF REPAIR Right 10/19/11, hyperlipidemia, overweight, severe B CTS, hx of low back pain with radicular symptoms.   Patient denies hx of cancer, stroke, spinal surgeries, lung problems, HTN, heart problems, diabetes, unexplained weight loss. Doe note some urinary  incontinence and feeling of something in her vagina that has not changed with neck symptoms.    Limitations Sitting;Lifting;House hold activities;Other (comment)    Diagnostic tests States she had a nerve conduction study at the wrists but she stopped them from doing the neck. Nerve Conduction Study report 05/28/2021:   Impression:  Abnormal study. There is electrodiagnostic evidence of chronic, severe bilateral carpal tunnel syndrome.   MR  - MR CERVICAL SPINE WO CONT  - Apr 02 2012  8:29AM      RESULT:     Comparison: None       Technique: Standard cervical spine protocol, without administration of IV   contrast.      Findings:   There is normal alignment.  Normal bone marrow signal.  Size and signal of   the spinal cord is within normal limits.  No cerebellar ectopia.      C2-C3: No significant disc bulge or neuroforaminal narrowing.      C3-C4: Mild posterior disc bulge causes effacement of the ventral CSF   space.   There is mild uncovertebral hypertrophy without significant neuroforaminal   narrowing.      C4-C5: Mild posterior disc bulge with superimposed left paracentral disc   protrusion causes moderate impression on the left paracentral spinal cord.   There is mild uncovertebral hypertrophy, without significant   neuroforaminal   narrowing.      C5-C6: Mild posterior disc bulge causes effacement of ventral CSF space.   Uncovertebral hypertrophy causes moderate right neuroforaminal narrowing.      C6-C7: Moderate posterior disc bulge effaces the CSF spaces and causes   slight impression on the spinal cord. Uncovertebral hypertrophy causes   moderate left, and mild right, neuroforaminal narrowing.      C7-T1: No significant disc bulge or neuroforaminal narrowing.      Mild posterior disc bulges seen at T1-T2, T4-T5, and T6-T7 on the sagittal   images only. There is a moderate posterior disc bulge at T5-T6 with slight   superior and inferior extension of disc material, seen on the sagittal   images only. This abuts the spinal cord.      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 There is moderate   right   neuroforaminal narrowing at C5-C6 and moderate left neuroforaminal   narrowing   at C6-C7.    Patient Stated Goals would like to improve her problems in her neck and arms by the time she needs to go back to work.    Currently in Pain? Yes    Pain Score 5    working last night             OBJECTIVE  SELF-REPORTED  FUNCTION FOTO score: 55/100 (neck questionnaire)  Lumbar AROM: Flexion: = fingers to ankles, pain feels better Extension: = 25%, reports pain goes down to right glute, but not increasing pain in her leg. Feels almost like she loses strength. . Rotation (hips blocked by PT): R = 50% pain down right leg, L = 50% pain down right leg.("Feels like something is getting stuck here") Side Flexion: R = WFL except feels like something is sticking her in the right low back, L = WFL  REPEATED MOTIONS TESTING Repeated lumbar extension in standing 2x10: pain in leg decreased Repeated lumbar extension in standing over plinth, 1x10: unclear but no worse.  Repeated lumbar extension in prone lying 1x10: very  difficult due to stain on arms, each repetition feels like something is stabbing her in the left lumbar spine, no pain in right leg.   LOWER LIMB [place] TESTS Straight Leg Raise (Sciatic nerve)  R  = positive for concordant pain  TREATMENT:    Therapeutic exercise: to centralize symptoms and improve ROM, strength, muscular endurance, and activity tolerance required for successful completion of functional activities.  - brief exam of back and right leg pain to assess condition to allow for appropriate recommendation of self-management techniques.  - Repeated lumbar extension in standing 2x10: pain in leg decreased - Repeated lumbar extension in standing over plinth, 1x10: unclear but no worse.  - education on centralization, peripheralization, back pain, neck pain, central/peripheral nervous systems and the connections and lack of connections between neck and legs.  - Repeated lumbar extension in prone lying 1x10: very difficult due to stain on arms, each repetition feels like something is stabbing her in the left lumbar spine, no pain in right leg.  - hooklying R sciatic nerve glide (slider technique), 1x15 - sitting with lumbar roll and correct posture - Education on HEP including handout    Pt  required multimodal cuing for proper technique and to facilitate improved neuromuscular control, strength, range of motion, and functional ability resulting in improved performance and form.   HOME EXERCISE PROGRAM Access Code: [email or web address] URL: [email or web address] Date: 08/14/2021 Prepared by: Rosita Kea  Exercises Supine Chin Tuck - 1 x daily - 1 sets - 20 reps - 5 seconds hold Supine Deep Neck Flexor Training - Repetitions - 2 sets - 10 reps - 2 seconds hold Prone W Scapular Retraction - 2 sets - 10 reps - 2-5 seconds hold Prone Angels - 1 x daily - 3 sets - 10 reps Sidelying Thoracic Rotation with Open Book - 1-2 x daily - 1 sets - 20 reps - 5 seconds hold Wrist Tendon Gliding - 3 x daily - 1 sets - 10 reps Row with band/cable - 1 x daily - 3 sets - 10 reps Seated Correct Posture Standing Lumbar Extension - 4 x daily - 1 sets - 10-15 reps - 1 second hold Standing Lumbar Extension with Counter - 4 x daily - 1 sets - 10-15 reps - 1 second hold Static Prone on Elbows - 2 x daily - 5 minutes hold Supine 90/90 Sciatic Nerve Glide with Knee Flexion/Extension - 2 x daily - 1 sets - 15 reps    PT Education - 08/14/21 1003     Education Details exercise/intervention purpose/form. education on centralization, peripheralization, back pain, neck pain, central/peripheral nervous systems and the connections and lack of connections between neck and legs.    Person(s) Educated Patient    Methods Explanation;Demonstration;Tactile cues;Verbal cues;Handout    Comprehension Verbalized understanding;Returned demonstration;Verbal cues required;Tactile cues required;Need further instruction              PT Short Term Goals - 06/16/21 1244       PT SHORT TERM GOAL #1   Title Be independent with initial home exercise program for self-management of symptoms.    Baseline Initial HEP to be provided at visit 2 as appropriate (06/10/2021);    Time 2    Period Weeks    Status Achieved     Target Date 06/25/21               PT Long Term Goals - 07/28/21 0933       PT LONG TERM GOAL #1  Title Be independent with a long-term home exercise program for self-management of symptoms.    Baseline Initial HEP to be provided at visit 2 as appropriate (06/10/2021); currently participating in appropriate HEP (07/28/2021);    Time 12    Period Weeks    Status Partially Met   TARGET DATE FOR ALL LONG TERM GOALS: 09/02/2021     PT LONG TERM GOAL #2   Title Demonstrate improved FOTO score by 10 units to demonstrate improvement in overall condition and self-reported functional ability.    Baseline to be measured visit 2 as appropriate (06/10/2021); 51 at visit 3 (06/16/2021); 53 (07/28/2021);    Time 12    Period Weeks    Status Partially Met      PT LONG TERM GOAL #3   Title Have full bilateral shoulder AROM with no compensations or increase in pain in all planes except intermittent end range discomfort to allow patient to complete valued activities such as reaching with less difficulty.    Baseline overhead ROM limited - see objective (06/11/2021); continues to be limited but is better than before - see objective (07/28/2021);    Time 12    Period Weeks    Status Partially Met      PT LONG TERM GOAL #4   Title Improve B UE strength to equal or greater than 4+/5 with no increase in pain to improve patient's ability to complete  valued functional tasks working, lifting, carrying, reaching with less difficulty.    Baseline Ranges from 3/5 to 4+/5 and increases pain - see objective (06/10/2021); most improved except shoulder abduction - continues to be painful and limited (07/28/2021);    Time 12    Period Weeks    Status Partially Met      PT LONG TERM GOAL #5   Title Complete community, work and/or recreational activities such as those that require use of B UE including ADLs, working, reaching, lifting, carrying, sleeping, sitting without limitation due to current condition.     Baseline currently reported as funcitonal limitations (06/10/2021); states she is less limited and her left UE gives her less trouble but continues to bother her R elbow (07/28/2021);    Time 12    Period Weeks    Status Partially Met      PT LONG TERM GOAL #6   Title Reduce pain with functional activities to equal or less than 3/10 to allow patient to complete usual activities including ADLs, IADLs, work, and social engagement with less difficulty.    Baseline worst 8/10 (06/10/2021); 6/10 (0/24/2022);    Time 12    Period Weeks    Status Partially Met                   Plan - 08/14/21 1008     Clinical Impression Statement In person Spanish interpreter present and assisted with communication throughout the session. Patient requesting advice on low back and right leg pain today, so screening was performed and HEP was provided for trial of independent management of low back and right leg symptoms. Screen suggests R lumbar radiculopathy and possible extension preference. Patient required extended time for communication to help her understand how the neck and low back/legs are and are not connected and how to interpret symptoms to determine if they are centralizing or peripherals. Patient advised if she continues to feel the need for more help with her back and leg pain she should consult with her doctor for advice and to get  a PT order if appropriate. FOTO score today shows continued modest improvement in self-reported function related to neck and B UE. Patient would benefit from continued management of limiting condition by skilled physical therapist to address remaining impairments and functional limitations to work towards stated goals and return to PLOF or maximal functional independence.    Personal Factors and Comorbidities Comorbidity 3+;Social Background;Profession;Past/Current Experience;Time since onset of injury/illness/exacerbation;Fitness;Age    Comorbidities ARTHROSCOPIC ROTATOR  CUFF REPAIR Right 10/19/11, hyperlipidemia, overweight, severe B CTS, hx of low back pain with radiculopathy    Examination-Activity Limitations Lift;Hygiene/Grooming;Caring for Others;Reach Overhead;Carry;Sleep    Examination-Participation Restrictions Community Activity;Occupation;Cleaning;Interpersonal Relationship;Other    Stability/Clinical Decision Making Evolving/Moderate complexity    Rehab Potential Fair    PT Duration 12 weeks    PT Treatment/Interventions ADLs/Self Care Home Management;Aquatic Therapy;Cryotherapy;Moist Heat;Electrical Stimulation;Therapeutic activities;Therapeutic exercise;Neuromuscular re-education;Manual techniques;Dry needling;Spinal Manipulations;Joint Manipulations;Passive range of motion    PT Next Visit Plan update HEP as appropriate, manual therapy as needed, postural strengthning    PT Home Exercise Plan Access Code: [email or web address]    Consulted and Agree with Plan of Care Patient             Patient will benefit from skilled therapeutic intervention in order to improve the following deficits and impairments:  Improper body mechanics, Pain, Postural dysfunction, Decreased activity tolerance, Decreased endurance, Decreased range of motion, Decreased strength, Hypomobility, Impaired perceived functional ability, Impaired UE functional use, Impaired sensation  Visit Diagnosis: Cervicalgia  Radiculopathy, cervical region  Muscle weakness (generalized)  Pain in right elbow     Problem List There are no problems to display for this patient.   Everlean Alstrom. Graylon Good, PT, DPT 08/14/21, 10:13 AM   [facility] [address], [place], [address] Phone: [phone]   Fax:  [phone]  Name: Jessica Holden MRN: [id number] Date of Birth: Feb 02, 1961

## 2021-08-19 ENCOUNTER — Ambulatory Visit: Payer: BLUE CROSS/BLUE SHIELD | Admitting: Physical Therapy

## 2021-08-19 DIAGNOSIS — M5412 Radiculopathy, cervical region: Secondary | ICD-10-CM

## 2021-08-19 DIAGNOSIS — M25521 Pain in right elbow: Secondary | ICD-10-CM

## 2021-08-19 DIAGNOSIS — M542 Cervicalgia: Secondary | ICD-10-CM | POA: Diagnosis not present

## 2021-08-19 DIAGNOSIS — M6281 Muscle weakness (generalized): Secondary | ICD-10-CM

## 2021-08-19 NOTE — Therapy (Signed)
[facility] [address] [place], [address] Phone: [phone]   Fax:  [phone]  Physical Therapy Treatment  Patient Details  Name: Jessica Holden MRN: [id number] Date of Birth: [birth year] Referring Provider (PT): Girtha Hake, MD   Encounter Date: 08/19/2021   PT End of Session - 08/19/21 0936     Visit Number 16    Number of Visits 24    Date for PT Re-Evaluation 09/02/21    Authorization Type [employer] reporting period from 07/28/21    Authorization - Visit Number 15    Authorization - Number of Visits 30    Progress Note Due on Visit 20    PT Start Time 0906    PT Stop Time 0944    PT Time Calculation (min) 38 min    Activity Tolerance Patient tolerated treatment well    Behavior During Therapy [facility] for tasks assessed/performed             Past Medical History:  Diagnosis Date   Hyperlipidemia     Past Surgical History:  Procedure Laterality Date   ARTHROSCOPIC ROTATOR CUFF REPAIR Right 10/19/2011    There were no vitals filed for this visit.   Subjective Assessment - 08/19/21 0934     Subjective Patient accompanied by in person Spanish interpreter who assisted with communication throughout session. Patient report she has a little pain, 5/10, in her right elbow and proximal forearm. She states she feels a little tense in her muscles. Since she has been changed at work to another line her left hand is better. This happened Sunday. She states her left hand used to go numb more but is better since she has been at the new line.  The lines are almost the same except where she is now she has to grab 2 bags instead of 1 like she did at the other line.  States her leg has been a little bit better. The exercises provided at last session helped a little and did not make it worse but she is still having difficulty with her leg.    Patient is accompained by: Interpreter     Pertinent History Patient is a 60 y.o. female who presents to outpatient physical therapy with a referral for medical diagnosis cervicalgia and cervical radiculopathy. This patient's chief complaints consist of constant lower neck pain and B UE paresthesia with intermittent headaches and pain to B hands and down back. Also complains of intermittent R sided LE numbness and R elbow pain and feeling it is "dislocated." Upper quarter, neck, head complaints lead to the following functional deficits: Difficulty with activities that require use of B UE including ADLs, working, reaching, lifting, carrying, sleeping, sitting. Relevant past medical history and comorbidities include ARTHROSCOPIC ROTATOR CUFF REPAIR Right 10/19/11, hyperlipidemia, overweight, severe B CTS, hx of low back pain with radicular symptoms.   Patient denies hx of cancer, stroke, spinal surgeries, lung problems, HTN, heart problems, diabetes, unexplained weight loss. Doe note some urinary incontinence and feeling of something in her vagina that has not changed with neck symptoms.    Limitations Sitting;Lifting;House hold activities;Other (comment)    Diagnostic tests States she had a nerve conduction study at the wrists but she stopped them from doing the neck. Nerve Conduction Study report 05/28/2021:   Impression:  Abnormal study. There is electrodiagnostic evidence of chronic, severe bilateral carpal tunnel syndrome.   MR  - MR CERVICAL SPINE WO CONT  -  Apr 02 2012  8:29AM      RESULT:     Comparison: None      Technique: Standard cervical spine protocol, without administration of IV   contrast.      Findings:   There is normal alignment.  Normal bone marrow signal.  Size and signal of   the spinal cord is within normal limits.  No cerebellar ectopia.      C2-C3: No significant disc bulge or neuroforaminal narrowing.      C3-C4: Mild posterior disc bulge causes effacement of the ventral CSF   space.   There is mild uncovertebral hypertrophy without  significant neuroforaminal   narrowing.      C4-C5: Mild posterior disc bulge with superimposed left paracentral disc   protrusion causes moderate impression on the left paracentral spinal cord.   There is mild uncovertebral hypertrophy, without significant   neuroforaminal   narrowing.      C5-C6: Mild posterior disc bulge causes effacement of ventral CSF space.   Uncovertebral hypertrophy causes moderate right neuroforaminal narrowing.      C6-C7: Moderate posterior disc bulge effaces the CSF spaces and causes   slight impression on the spinal cord. Uncovertebral hypertrophy causes   moderate left, and mild right, neuroforaminal narrowing.      C7-T1: No significant disc bulge or neuroforaminal narrowing.      Mild posterior disc bulges seen at T1-T2, T4-T5, and T6-T7 on the sagittal   images only. There is a moderate posterior disc bulge at T5-T6 with slight   superior and inferior extension of disc material, seen on the sagittal   images only. This abuts the spinal cord.      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 There is moderate   right   neuroforaminal narrowing at C5-C6 and moderate left neuroforaminal   narrowing   at C6-C7.    Patient Stated Goals would like to improve her problems in her neck and arms by the time she needs to go back to work.    Currently in Pain? Yes    Pain Score 5                    TREATMENT:    TREATMENT:  Manual therapy: to reduce pain and tissue tension, improve range of motion, neuromodulation, in order to promote improved ability to complete functional activities. HOOKLYING - STM to posterior cervical spine focusing on bilateral suboccipital region, cervical paraspinals, levator scapulae, and upper traps. - STM to bilateral posterior rotator cuff    Therapeutic exercise: to centralize symptoms and improve ROM, strength, muscular endurance, and activity  tolerance required for successful completion of functional activities.  - Standing cervical thoracic extension/BUE flexion and serratus anterior activation, lat stretch, with foam roller up wall, 5 second holds, 1x20  Circuit: - standing scapular row with 25# cable, 2x10 (rates hard difficulty) - seated overhead press with 4# DB each hand, 2x10 (rates medium difficulty).    - seated lat pull, 1x10 at 25# (rates hard) - seated chest press, 1x10 at 10#. (Rates hard).   Pt required multimodal cuing for proper technique and to facilitate improved neuromuscular control, strength, range of motion, and functional ability resulting in improved performance and form.  HOME EXERCISE PROGRAM Access Code: [email or web address] URL: [email or web address] Date: 08/14/2021 Prepared by: Rosita Kea   Exercises Supine Chin Tuck - 1 x daily - 1  sets - 20 reps - 5 seconds hold Supine Deep Neck Flexor Training - Repetitions - 2 sets - 10 reps - 2 seconds hold Prone W Scapular Retraction - 2 sets - 10 reps - 2-5 seconds hold Prone Angels - 1 x daily - 3 sets - 10 reps Sidelying Thoracic Rotation with Open Book - 1-2 x daily - 1 sets - 20 reps - 5 seconds hold Wrist Tendon Gliding - 3 x daily - 1 sets - 10 reps Row with band/cable - 1 x daily - 3 sets - 10 reps Seated Correct Posture Standing Lumbar Extension - 4 x daily - 1 sets - 10-15 reps - 1 second hold Standing Lumbar Extension with Counter - 4 x daily - 1 sets - 10-15 reps - 1 second hold Static Prone on Elbows - 2 x daily - 5 minutes hold Supine 90/90 Sciatic Nerve Glide with Knee Flexion/Extension - 2 x daily - 1 sets - 15 reps      PT Education - 08/19/21 0936     Education Details exercise/intervention purpose/form    Person(s) Educated Patient    Methods Explanation;Demonstration;Tactile cues;Verbal cues    Comprehension Verbalized understanding;Returned demonstration;Verbal cues required;Tactile cues required;Need further instruction               PT Short Term Goals - 06/16/21 1244       PT SHORT TERM GOAL #1   Title Be independent with initial home exercise program for self-management of symptoms.    Baseline Initial HEP to be provided at visit 2 as appropriate (06/10/2021);    Time 2    Period Weeks    Status Achieved    Target Date 06/25/21               PT Long Term Goals - 07/28/21 0933       PT LONG TERM GOAL #1   Title Be independent with a long-term home exercise program for self-management of symptoms.    Baseline Initial HEP to be provided at visit 2 as appropriate (06/10/2021); currently participating in appropriate HEP (07/28/2021);    Time 12    Period Weeks    Status Partially Met   TARGET DATE FOR ALL LONG TERM GOALS: 09/02/2021     PT LONG TERM GOAL #2   Title Demonstrate improved FOTO score by 10 units to demonstrate improvement in overall condition and self-reported functional ability.    Baseline to be measured visit 2 as appropriate (06/10/2021); 51 at visit 3 (06/16/2021); 53 (07/28/2021);    Time 12    Period Weeks    Status Partially Met      PT LONG TERM GOAL #3   Title Have full bilateral shoulder AROM with no compensations or increase in pain in all planes except intermittent end range discomfort to allow patient to complete valued activities such as reaching with less difficulty.    Baseline overhead ROM limited - see objective (06/11/2021); continues to be limited but is better than before - see objective (07/28/2021);    Time 12    Period Weeks    Status Partially Met      PT LONG TERM GOAL #4   Title Improve B UE strength to equal or greater than 4+/5 with no increase in pain to improve patient's ability to complete  valued functional tasks working, lifting, carrying, reaching with less difficulty.    Baseline Ranges from 3/5 to 4+/5 and increases pain - see objective (06/10/2021); most improved except  shoulder abduction - continues to be painful and limited (07/28/2021);     Time 12    Period Weeks    Status Partially Met      PT LONG TERM GOAL #5   Title Complete community, work and/or recreational activities such as those that require use of B UE including ADLs, working, reaching, lifting, carrying, sleeping, sitting without limitation due to current condition.    Baseline currently reported as funcitonal limitations (06/10/2021); states she is less limited and her left UE gives her less trouble but continues to bother her R elbow (07/28/2021);    Time 12    Period Weeks    Status Partially Met      PT LONG TERM GOAL #6   Title Reduce pain with functional activities to equal or less than 3/10 to allow patient to complete usual activities including ADLs, IADLs, work, and social engagement with less difficulty.    Baseline worst 8/10 (06/10/2021); 6/10 (0/24/2022);    Time 12    Period Weeks    Status Partially Met                   Plan - 08/19/21 0941     Clinical Impression Statement In person Spanish interpreter present and assisted with communication throughout the session. Patient tolerated treatment well overall and returned to upper quarter strengthening. Continues to have tight bands in B UT. Discussed dry needling and patient did not want to proceed with that intervention. Patient continues to have limitations in UE use and quality of life. Patient would benefit from continued management of limiting condition by skilled physical therapist to address remaining impairments and functional limitations to work towards stated goals and return to PLOF or maximal functional independence.    Personal Factors and Comorbidities Comorbidity 3+;Social Background;Profession;Past/Current Experience;Time since onset of injury/illness/exacerbation;Fitness;Age    Comorbidities ARTHROSCOPIC ROTATOR CUFF REPAIR Right 10/19/11, hyperlipidemia, overweight, severe B CTS, hx of low back pain with radiculopathy    Examination-Activity Limitations  Lift;Hygiene/Grooming;Caring for Others;Reach Overhead;Carry;Sleep    Examination-Participation Restrictions Community Activity;Occupation;Cleaning;Interpersonal Relationship;Other    Stability/Clinical Decision Making Evolving/Moderate complexity    Rehab Potential Fair    PT Duration 12 weeks    PT Treatment/Interventions ADLs/Self Care Home Management;Aquatic Therapy;Cryotherapy;Moist Heat;Electrical Stimulation;Therapeutic activities;Therapeutic exercise;Neuromuscular re-education;Manual techniques;Dry needling;Spinal Manipulations;Joint Manipulations;Passive range of motion    PT Next Visit Plan update HEP as appropriate, manual therapy as needed, postural strengthning    PT Home Exercise Plan Access Code: [email or web address]    Consulted and Agree with Plan of Care Patient             Patient will benefit from skilled therapeutic intervention in order to improve the following deficits and impairments:  Improper body mechanics, Pain, Postural dysfunction, Decreased activity tolerance, Decreased endurance, Decreased range of motion, Decreased strength, Hypomobility, Impaired perceived functional ability, Impaired UE functional use, Impaired sensation  Visit Diagnosis: Cervicalgia  Radiculopathy, cervical region  Muscle weakness (generalized)  Pain in right elbow     Problem List There are no problems to display for this patient.   Everlean Alstrom. Graylon Good, PT, DPT 08/19/21, 9:42 AM   [facility] [address] [place], [address] Phone: [phone]   Fax:  [phone]  Name: Genna Casimir MRN: [id number] Date of Birth: 1960-10-20

## 2021-08-21 ENCOUNTER — Ambulatory Visit: Payer: BLUE CROSS/BLUE SHIELD | Admitting: Physical Therapy

## 2021-08-21 ENCOUNTER — Telehealth: Payer: Self-pay | Admitting: Physical Therapy

## 2021-08-21 NOTE — Telephone Encounter (Signed)
Called patient utilizing in person Spanish interpreter Jaqui L. from [facility].  Patient answered and said she forgot her appointment. Said her daughter had emergency surgery and her mind has not been well organized. She was apologetic.   Luretha Murphy. Ilsa Iha, PT, DPT 08/21/21, 9:17 AM

## 2021-08-26 ENCOUNTER — Encounter: Payer: Self-pay | Admitting: Physical Therapy

## 2021-08-26 ENCOUNTER — Ambulatory Visit: Payer: BLUE CROSS/BLUE SHIELD | Admitting: Physical Therapy

## 2021-08-26 DIAGNOSIS — M6281 Muscle weakness (generalized): Secondary | ICD-10-CM

## 2021-08-26 DIAGNOSIS — M542 Cervicalgia: Secondary | ICD-10-CM

## 2021-08-26 DIAGNOSIS — M5412 Radiculopathy, cervical region: Secondary | ICD-10-CM

## 2021-08-26 DIAGNOSIS — M25521 Pain in right elbow: Secondary | ICD-10-CM

## 2021-08-26 NOTE — Therapy (Signed)
[facility] [address] [place], [address] Phone: [phone]   Fax:  [phone]  Physical Therapy Treatment  Patient Details  Name: Jessica Holden MRN: [id number] Date of Birth: 07-Feb-1961 Referring Provider (PT): Girtha Hake, MD   Encounter Date: 08/26/2021   PT End of Session - 08/26/21 0925     Visit Number 17    Number of Visits 24    Date for PT Re-Evaluation 09/02/21    Authorization Type [employer] reporting period from 07/28/21    Authorization - Visit Number 16    Authorization - Number of Visits 30    Progress Note Due on Visit 20    PT Start Time 0905    PT Stop Time 0943    PT Time Calculation (min) 38 min    Activity Tolerance Patient tolerated treatment well    Behavior During Therapy [facility] for tasks assessed/performed             Past Medical History:  Diagnosis Date   Hyperlipidemia     Past Surgical History:  Procedure Laterality Date   ARTHROSCOPIC ROTATOR CUFF REPAIR Right 10/19/2011    There were no vitals filed for this visit.   Subjective Assessment - 08/26/21 0921     Subjective Patient accompanied by in person Spanish interpreter who assisted with communication throughout session. Patient reports she missed her last PT session because her daughter had emergency appendectomy the night before and she forgot. She has been off work since then to help take care of her daughter. Patient reports she and her family are not doing well because her nephew's cancer has been found to be terminal. States her right arm was hurting a lot last night but it felt better with a compression sleeve on it. Left hand continues to go numb but does not hurt like the right. She does not provided a numeric pain rating today.    Patient is accompained by: Interpreter    Pertinent History Patient is a 60 y.o. female who presents to outpatient physical therapy with a referral  for medical diagnosis cervicalgia and cervical radiculopathy. This patient's chief complaints consist of constant lower neck pain and B UE paresthesia with intermittent headaches and pain to B hands and down back. Also complains of intermittent R sided LE numbness and R elbow pain and feeling it is "dislocated." Upper quarter, neck, head complaints lead to the following functional deficits: Difficulty with activities that require use of B UE including ADLs, working, reaching, lifting, carrying, sleeping, sitting. Relevant past medical history and comorbidities include ARTHROSCOPIC ROTATOR CUFF REPAIR Right 10/19/11, hyperlipidemia, overweight, severe B CTS, hx of low back pain with radicular symptoms.   Patient denies hx of cancer, stroke, spinal surgeries, lung problems, HTN, heart problems, diabetes, unexplained weight loss. Doe note some urinary incontinence and feeling of something in her vagina that has not changed with neck symptoms.    Limitations Sitting;Lifting;House hold activities;Other (comment)    Diagnostic tests States she had a nerve conduction study at the wrists but she stopped them from doing the neck. Nerve Conduction Study report 05/28/2021:   Impression:  Abnormal study. There is electrodiagnostic evidence of chronic, severe bilateral carpal tunnel syndrome.   MR  - MR CERVICAL SPINE WO CONT  - Apr 02 2012  8:29AM      RESULT:     Comparison: None      Technique: Standard cervical spine protocol, without administration  of IV   contrast.      Findings:   There is normal alignment.  Normal bone marrow signal.  Size and signal of   the spinal cord is within normal limits.  No cerebellar ectopia.      C2-C3: No significant disc bulge or neuroforaminal narrowing.      C3-C4: Mild posterior disc bulge causes effacement of the ventral CSF   space.   There is mild uncovertebral hypertrophy without significant neuroforaminal   narrowing.      C4-C5: Mild posterior disc bulge with superimposed left  paracentral disc   protrusion causes moderate impression on the left paracentral spinal cord.   There is mild uncovertebral hypertrophy, without significant   neuroforaminal   narrowing.      C5-C6: Mild posterior disc bulge causes effacement of ventral CSF space.   Uncovertebral hypertrophy causes moderate right neuroforaminal narrowing.      C6-C7: Moderate posterior disc bulge effaces the CSF spaces and causes   slight impression on the spinal cord. Uncovertebral hypertrophy causes   moderate left, and mild right, neuroforaminal narrowing.      C7-T1: No significant disc bulge or neuroforaminal narrowing.      Mild posterior disc bulges seen at T1-T2, T4-T5, and T6-T7 on the sagittal   images only. There is a moderate posterior disc bulge at T5-T6 with slight   superior and inferior extension of disc material, seen on the sagittal   images only. This abuts the spinal cord.      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 There is moderate   right   neuroforaminal narrowing at C5-C6 and moderate left neuroforaminal   narrowing   at C6-C7.    Patient Stated Goals would like to improve her problems in her neck and arms by the time she needs to go back to work.    Currently in Pain? Yes                 TREATMENT:   denies sensitivity to latex  Manual therapy: to reduce pain and tissue tension, improve range of motion, neuromodulation, in order to promote improved ability to complete functional activities. HOOKLYING - STM to posterior cervical spine focusing on bilateral suboccipital region, cervical paraspinals, levator scapulae, and upper traps. - STM to bilateral posterior rotator cuff    Therapeutic exercise: to centralize symptoms and improve ROM, strength, muscular endurance, and activity tolerance required for successful completion of functional activities.  - Standing cervical thoracic  extension/BUE flexion and serratus anterior activation, lat stretch, with foam roller up wall, 5 second holds, 1x20   Circuit: - standing scapular row with 25# cable, 2x10 (rates medium difficulty) - seated overhead press with 5# DB each hand, 2x10 (rates medium difficulty).     Circuit: - seated lat pull, 2x10 at 25# (rates hard) - seated chest press, 2x10 at 10#. (medium hard).  - push ups on TM bar, 1x10    Pt required multimodal cuing for proper technique and to facilitate improved neuromuscular control, strength, range of motion, and functional ability resulting in improved performance and form.   HOME EXERCISE PROGRAM Access Code: [email or web address] URL: [email or web address] Date: 08/14/2021 Prepared by: Rosita Kea   Exercises Supine Chin Tuck - 1 x daily - 1 sets - 20 reps - 5 seconds hold Supine Deep Neck Flexor Training - Repetitions - 2 sets - 10 reps - 2  seconds hold Prone W Scapular Retraction - 2 sets - 10 reps - 2-5 seconds hold Prone Angels - 1 x daily - 3 sets - 10 reps Sidelying Thoracic Rotation with Open Book - 1-2 x daily - 1 sets - 20 reps - 5 seconds hold Wrist Tendon Gliding - 3 x daily - 1 sets - 10 reps Row with band/cable - 1 x daily - 3 sets - 10 reps Seated Correct Posture Standing Lumbar Extension - 4 x daily - 1 sets - 10-15 reps - 1 second hold Standing Lumbar Extension with Counter - 4 x daily - 1 sets - 10-15 reps - 1 second hold Static Prone on Elbows - 2 x daily - 5 minutes hold Supine 90/90 Sciatic Nerve Glide with Knee Flexion/Extension - 2 x daily - 1 sets - 15 reps     PT Education - 08/26/21 0924     Education Details exercise/intervention purpose/form    Person(s) Educated Patient    Methods Explanation;Demonstration;Tactile cues;Verbal cues    Comprehension Need further instruction;Verbalized understanding;Returned demonstration;Verbal cues required;Tactile cues required              PT Short Term Goals - 06/16/21 1244        PT SHORT TERM GOAL #1   Title Be independent with initial home exercise program for self-management of symptoms.    Baseline Initial HEP to be provided at visit 2 as appropriate (06/10/2021);    Time 2    Period Weeks    Status Achieved    Target Date 06/25/21               PT Long Term Goals - 07/28/21 0933       PT LONG TERM GOAL #1   Title Be independent with a long-term home exercise program for self-management of symptoms.    Baseline Initial HEP to be provided at visit 2 as appropriate (06/10/2021); currently participating in appropriate HEP (07/28/2021);    Time 12    Period Weeks    Status Partially Met   TARGET DATE FOR ALL LONG TERM GOALS: 09/02/2021     PT LONG TERM GOAL #2   Title Demonstrate improved FOTO score by 10 units to demonstrate improvement in overall condition and self-reported functional ability.    Baseline to be measured visit 2 as appropriate (06/10/2021); 51 at visit 3 (06/16/2021); 53 (07/28/2021);    Time 12    Period Weeks    Status Partially Met      PT LONG TERM GOAL #3   Title Have full bilateral shoulder AROM with no compensations or increase in pain in all planes except intermittent end range discomfort to allow patient to complete valued activities such as reaching with less difficulty.    Baseline overhead ROM limited - see objective (06/11/2021); continues to be limited but is better than before - see objective (07/28/2021);    Time 12    Period Weeks    Status Partially Met      PT LONG TERM GOAL #4   Title Improve B UE strength to equal or greater than 4+/5 with no increase in pain to improve patient's ability to complete  valued functional tasks working, lifting, carrying, reaching with less difficulty.    Baseline Ranges from 3/5 to 4+/5 and increases pain - see objective (06/10/2021); most improved except shoulder abduction - continues to be painful and limited (07/28/2021);    Time 12    Period Weeks    Status  Partially Met       PT LONG TERM GOAL #5   Title Complete community, work and/or recreational activities such as those that require use of B UE including ADLs, working, reaching, lifting, carrying, sleeping, sitting without limitation due to current condition.    Baseline currently reported as funcitonal limitations (06/10/2021); states she is less limited and her left UE gives her less trouble but continues to bother her R elbow (07/28/2021);    Time 12    Period Weeks    Status Partially Met      PT LONG TERM GOAL #6   Title Reduce pain with functional activities to equal or less than 3/10 to allow patient to complete usual activities including ADLs, IADLs, work, and social engagement with less difficulty.    Baseline worst 8/10 (06/10/2021); 6/10 (0/24/2022);    Time 12    Period Weeks    Status Partially Met                   Plan - 08/26/21 1048     Clinical Impression Statement In person Spanish interpreter present and assisted with communication throughout the session. Patient tolerated treatment well overall and was able to complete all exercises without report of increased pain. Continues to have similar numbness in left hand and complaints of pain at R elbow. Notes pain down R UE with STM to R posterior RTC. Patient expected to discharge to independent HEP next session. Plan to update HEP to include exercises completed in session to decrease worsening of symptoms. . Patient would benefit from continued management of limiting condition by skilled physical therapist to address remaining impairments and functional limitations to work towards stated goals and return to PLOF or maximal functional independence.    Personal Factors and Comorbidities Comorbidity 3+;Social Background;Profession;Past/Current Experience;Time since onset of injury/illness/exacerbation;Fitness;Age    Comorbidities ARTHROSCOPIC ROTATOR CUFF REPAIR Right 10/19/11, hyperlipidemia, overweight, severe B CTS, hx of low back pain with  radiculopathy    Examination-Activity Limitations Lift;Hygiene/Grooming;Caring for Others;Reach Overhead;Carry;Sleep    Examination-Participation Restrictions Community Activity;Occupation;Cleaning;Interpersonal Relationship;Other    Stability/Clinical Decision Making Evolving/Moderate complexity    Rehab Potential Fair    PT Duration 12 weeks    PT Treatment/Interventions ADLs/Self Care Home Management;Aquatic Therapy;Cryotherapy;Moist Heat;Electrical Stimulation;Therapeutic activities;Therapeutic exercise;Neuromuscular re-education;Manual techniques;Dry needling;Spinal Manipulations;Joint Manipulations;Passive range of motion    PT Next Visit Plan update HEP as appropriate, manual therapy as needed, postural strengthning    PT Home Exercise Plan Access Code: [email or web address]    Consulted and Agree with Plan of Care Patient             Patient will benefit from skilled therapeutic intervention in order to improve the following deficits and impairments:  Improper body mechanics, Pain, Postural dysfunction, Decreased activity tolerance, Decreased endurance, Decreased range of motion, Decreased strength, Hypomobility, Impaired perceived functional ability, Impaired UE functional use, Impaired sensation  Visit Diagnosis: Cervicalgia  Radiculopathy, cervical region  Muscle weakness (generalized)  Pain in right elbow     Problem List There are no problems to display for this patient.   Everlean Alstrom. Graylon Good, PT, DPT 08/26/21, 10:48 AM  [facility] [address] [place], [address] Phone: [phone]   Fax:  [phone]  Name: Jessica Holden MRN: [id number] Date of Birth: [birth year]

## 2021-09-01 ENCOUNTER — Ambulatory Visit: Payer: BLUE CROSS/BLUE SHIELD | Admitting: Physical Therapy

## 2021-09-01 ENCOUNTER — Encounter: Payer: Self-pay | Admitting: Physical Therapy

## 2021-09-01 DIAGNOSIS — M6281 Muscle weakness (generalized): Secondary | ICD-10-CM

## 2021-09-01 DIAGNOSIS — M542 Cervicalgia: Secondary | ICD-10-CM | POA: Diagnosis not present

## 2021-09-01 DIAGNOSIS — M5412 Radiculopathy, cervical region: Secondary | ICD-10-CM

## 2021-09-01 DIAGNOSIS — M25521 Pain in right elbow: Secondary | ICD-10-CM

## 2021-09-01 NOTE — Therapy (Signed)
[facility] [address] [place], [address] Phone: [phone]   Fax:  [phone]  Physical Therapy Treatment / Discharge Summary Dates of reporting from 06/10/2021 to 09/01/2021  Patient Details  Name: Jessica Holden MRN: [id number] Date of Birth: 31-May-1961 Referring Provider (PT): Girtha Hake, MD   Encounter Date: 09/01/2021   PT End of Session - 09/01/21 0915     Visit Number 18    Number of Visits 24    Date for PT Re-Evaluation 09/02/21    Authorization Type [employer] reporting period from 07/28/21    Authorization - Visit Number 28    Authorization - Number of Visits 30    Progress Note Due on Visit 20    PT Start Time 0904    PT Stop Time 0945    PT Time Calculation (min) 41 min    Activity Tolerance Patient tolerated treatment well    Behavior During Therapy [facility] for tasks assessed/performed             Past Medical History:  Diagnosis Date   Hyperlipidemia     Past Surgical History:  Procedure Laterality Date   ARTHROSCOPIC ROTATOR CUFF REPAIR Right 10/19/2011    There were no vitals filed for this visit.   Subjective Assessment - 09/01/21 0905     Subjective Patient accompanied by in person Spanish interpreter who assisted with communication throughout session. Patient reports her right arm hurt at work last night so she had to take tylenol and her right foot hurt so much she took ibuprofen. She state she would like to be referred back to her original doctor so that she can ask about her low back and leg which her doctor told her was related to her neck. Patient states at this point she does not think it is related to her neck due to her neck getting better but not her leg. She would like imaging for her low back. She reports no pain in her neck. She states the right elbow is feeling okay right now after taking tylenol and motrin; earlier it was 8/10 and hurt  quite a bit. She states the right arm just feels tired now. Her left hand continues to be numb. States she feels like she "hurt herself" working last night but overall her pain and function has improved since starting PT.    Patient is accompained by: Interpreter    Pertinent History Patient is a 60 y.o. female who presents to outpatient physical therapy with a referral for medical diagnosis cervicalgia and cervical radiculopathy. This patient's chief complaints consist of constant lower neck pain and B UE paresthesia with intermittent headaches and pain to B hands and down back. Also complains of intermittent R sided LE numbness and R elbow pain and feeling it is "dislocated." Upper quarter, neck, head complaints lead to the following functional deficits: Difficulty with activities that require use of B UE including ADLs, working, reaching, lifting, carrying, sleeping, sitting. Relevant past medical history and comorbidities include ARTHROSCOPIC ROTATOR CUFF REPAIR Right 10/19/11, hyperlipidemia, overweight, severe B CTS, hx of low back pain with radicular symptoms.   Patient denies hx of cancer, stroke, spinal surgeries, lung problems, HTN, heart problems, diabetes, unexplained weight loss. Doe note some urinary incontinence and feeling of something in her vagina that has not changed with neck symptoms.    Limitations Sitting;Lifting;House hold activities;Other (comment)    Diagnostic tests States she had a nerve conduction  study at the wrists but she stopped them from doing the neck. Nerve Conduction Study report 05/28/2021:   Impression:  Abnormal study. There is electrodiagnostic evidence of chronic, severe bilateral carpal tunnel syndrome.   MR  - MR CERVICAL SPINE WO CONT  - Apr 02 2012  8:29AM      RESULT:     Comparison: None      Technique: Standard cervical spine protocol, without administration of IV   contrast.      Findings:   There is normal alignment.  Normal bone marrow signal.  Size and signal of    the spinal cord is within normal limits.  No cerebellar ectopia.      C2-C3: No significant disc bulge or neuroforaminal narrowing.      C3-C4: Mild posterior disc bulge causes effacement of the ventral CSF   space.   There is mild uncovertebral hypertrophy without significant neuroforaminal   narrowing.      C4-C5: Mild posterior disc bulge with superimposed left paracentral disc   protrusion causes moderate impression on the left paracentral spinal cord.   There is mild uncovertebral hypertrophy, without significant   neuroforaminal   narrowing.      C5-C6: Mild posterior disc bulge causes effacement of ventral CSF space.   Uncovertebral hypertrophy causes moderate right neuroforaminal narrowing.      C6-C7: Moderate posterior disc bulge effaces the CSF spaces and causes   slight impression on the spinal cord. Uncovertebral hypertrophy causes   moderate left, and mild right, neuroforaminal narrowing.      C7-T1: No significant disc bulge or neuroforaminal narrowing.      Mild posterior disc bulges seen at T1-T2, T4-T5, and T6-T7 on the sagittal   images only. There is a moderate posterior disc bulge at T5-T6 with slight   superior and inferior extension of disc material, seen on the sagittal   images only. This abuts the spinal cord.      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 There is moderate   right   neuroforaminal narrowing at C5-C6 and moderate left neuroforaminal   narrowing   at C6-C7.    Patient Stated Goals would like to improve her problems in her neck and arms by the time she needs to go back to work.    Currently in Pain? No/denies              OBJECTIVE   SELF-REPORTED FUNCTION FOTO score: 51/100 (neck questionnaire, assisted by Spanish interpreter)     SPINE MOTION Cervical Spine AROM *denotes pain Flexion: = 40 increased tension Extension: = 40 increased pain  Rotation: R= 65,  L = 55 ipsilateral pull at shoulder Side Flexion: R= 32 pull at contralateral neck, L = 45 pull at nuchal area.   PERIPHERAL JOINT MOTION (in degrees) Active Range of Motion (AROM) Shoulder           Flexion: R = 136*, L = 128.  Abduction: R = 121*, L = 127*. Extension: R = 53,* L = 48 External rotation: R = 114*, L = 94 Internal rotation: R = T12*, L = T10   MUSCLE PERFORMANCE (MMT):  *Indicates pain 06/10/21 07/28/21 09/01/21  Joint/Motion R/L R/L R/L  Shoulder        Elevation 4+/4+ 5/5    Flexion 3+*/3* 3+*/4 4*/4  Abduction 4*/4* 3+*/3+* 4*/4*  External rotation 3*/4 3*/4* 3*/4  Internal rotation 3+*/3+* 4+/4+ 3+*/3+  Elbow        Flexion 4/4 4+/4+ 4*/4*  Extension 3+*/3 4+*/4+ 4*/4+     TREATMENT:   denies sensitivity to latex  Therapeutic exercise: to centralize symptoms and improve ROM, strength, muscular endurance, and activity tolerance required for successful completion of functional activities.  - measurements to assess progress (see above) - demonstration on how to use theracane and LAX ball for self STM - push ups on counter, 1x5 - seated overhead press with red theraband, 1x5 - demonstrated standing row with red theraband - seated lat pull with red theraband attached over top of door, 1x5 - Education on HEP including handout   Manual therapy: to reduce pain and tissue tension, improve range of motion, neuromodulation, in order to promote improved ability to complete functional activities. HOOKLYING - STM to posterior cervical spine focusing on bilateral suboccipital region, cervical paraspinals, levator scapulae, and upper traps.   Pt required multimodal cuing for proper technique and to facilitate improved neuromuscular control, strength, range of motion, and functional ability resulting in improved performance and form.   HOME EXERCISE PROGRAM Access Code: [email or web address] URL: [email or web address] Date: 09/01/2021 Prepared by: Rosita Kea  Exercises Supine Chin Tuck - 1 x daily - 1 sets - 20 reps - 5 seconds hold Supine Deep Neck Flexor Training - Repetitions - 2 sets - 10 reps - 2 seconds hold Prone W Scapular Retraction - 2 sets - 10 reps - 2-5 seconds hold Prone Angels - 1 x daily - 3 sets - 10 reps Sidelying Thoracic Rotation with Open Book - 1-2 x daily - 1 sets - 20 reps - 5 seconds hold Wrist Tendon Gliding - 3 x daily - 1 sets - 10 reps Seated Correct Posture Standing Lumbar Extension - 4 x daily - 1 sets - 10-15 reps - 1 second hold Standing Lumbar Extension with Counter - 4 x daily - 1 sets - 10-15 reps - 1 second hold Static Prone on Elbows - 2 x daily - 5 minutes hold Supine 90/90 Sciatic Nerve Glide with Knee Flexion/Extension - 2 x daily - 1 sets - 15 reps Row with band/cable - 2-3 x weekly - 2-3 sets - 10 reps Seated Shoulder Press with Resistance - 2-3 x weekly - 2-3 sets - 10 reps Lat Pull with band/cable - 2-3 x weekly - 2-3 sets - 10 reps Push Up on Table - 2-3 x weekly - 2-3 sets - 10 reps     PT Education - 09/01/21 0914     Education Details exercise/intervention purpose/form. POC, discharge reccomendations.    Person(s) Educated Patient    Methods Explanation;Demonstration    Comprehension Verbalized understanding;Returned demonstration              PT Short Term Goals - 06/16/21 1244       PT SHORT TERM GOAL #1   Title Be independent with initial home exercise program for self-management of symptoms.    Baseline Initial HEP to be provided at visit 2 as appropriate (06/10/2021);    Time 2    Period Weeks    Status Achieved    Target Date 06/25/21               PT Long Term Goals - 09/01/21 1015       PT LONG TERM GOAL #1   Title Be independent with a long-term home exercise program for self-management of symptoms.    Baseline Initial HEP to be  provided at visit 2 as appropriate (06/10/2021); currently participating in appropriate HEP (07/28/2021);    Time 12     Period Weeks    Status Achieved   TARGET DATE FOR ALL LONG TERM GOALS: 09/02/2021     PT LONG TERM GOAL #2   Title Demonstrate improved FOTO score by 10 units to demonstrate improvement in overall condition and self-reported functional ability.    Baseline to be measured visit 2 as appropriate (06/10/2021); 51 at visit 3 (06/16/2021); 53 (07/28/2021); 51 at visit 18 (09/01/2021);    Time 12    Period Weeks    Status Not Met      PT LONG TERM GOAL #3   Title Have full bilateral shoulder AROM with no compensations or increase in pain in all planes except intermittent end range discomfort to allow patient to complete valued activities such as reaching with less difficulty.    Baseline overhead ROM limited - see objective (06/11/2021); continues to be limited but is better than before - see objective (07/28/2021); continues to be limited - see objective (09/01/2021);    Time 12    Period Weeks    Status Not Met      PT LONG TERM GOAL #4   Title Improve B UE strength to equal or greater than 4+/5 with no increase in pain to improve patient's ability to complete  valued functional tasks working, lifting, carrying, reaching with less difficulty.    Baseline Ranges from 3/5 to 4+/5 and increases pain - see objective (06/10/2021); most improved except shoulder abduction - continues to be painful and limited (07/28/2021); continues to be painful and limited but improved from IEf or most motions (09/01/2021);    Time 12    Period Weeks    Status Partially Met      PT LONG TERM GOAL #5   Title Complete community, work and/or recreational activities such as those that require use of B UE including ADLs, working, reaching, lifting, carrying, sleeping, sitting without limitation due to current condition.    Baseline currently reported as funcitonal limitations (06/10/2021); states she is less limited and her left UE gives her less trouble but continues to bother her R elbow (07/28/2021); has returned to work but  continues to have funcitonal limitations (09/01/2021);    Time 12    Period Weeks    Status Partially Met      PT LONG TERM GOAL #6   Title Reduce pain with functional activities to equal or less than 3/10 to allow patient to complete usual activities including ADLs, IADLs, work, and social engagement with less difficulty.    Baseline worst 8/10 (06/10/2021); 6/10 (0/24/2022); 8/10 last night (09/01/2021);    Time 12    Period Weeks    Status Not Met                   Plan - 09/01/21 1014     Clinical Impression Statement In person Spanish interpreter present and assisted with communication throughout the session. Patient has attended 18 physical therapy sessions this episode of care. She has made progress towards goals since starting PT and has been able to return to work, but since her last progress note has made little additional progress. Her FOTO score (self reported function measure) initially improved by 3 points but today is the same score as it was at the start of PT (51/100). Since start of care she demonstrates improved cervical spine rotation and UE strength, and she reports  decreased pain in the neck and arms. She continues to experience pain in the right elbow region and numbness in the left hand that makes it difficult for her to work. Patient is now discharged from PT due to apparently achieving maximal improvement with PT at this time. She has been provided with a comprehensive HEP help preserve her progress and function. PT recommends patient return to physician for continued medical evaluation.    Personal Factors and Comorbidities Comorbidity 3+;Social Background;Profession;Past/Current Experience;Time since onset of injury/illness/exacerbation;Fitness;Age    Comorbidities ARTHROSCOPIC ROTATOR CUFF REPAIR Right 10/19/11, hyperlipidemia, overweight, severe B CTS, hx of low back pain with radiculopathy    Examination-Activity Limitations Lift;Hygiene/Grooming;Caring for  Others;Reach Overhead;Carry;Sleep    Examination-Participation Restrictions Community Activity;Occupation;Cleaning;Interpersonal Relationship;Other    Stability/Clinical Decision Making Evolving/Moderate complexity    Rehab Potential Fair    PT Duration 12 weeks    PT Treatment/Interventions ADLs/Self Care Home Management;Aquatic Therapy;Cryotherapy;Moist Heat;Electrical Stimulation;Therapeutic activities;Therapeutic exercise;Neuromuscular re-education;Manual techniques;Dry needling;Spinal Manipulations;Joint Manipulations;Passive range of motion    PT Next Visit Plan Patient is now discharged from PT    PT Home Exercise Plan Access Code: [email or web address]    Consulted and Agree with Plan of Care Patient             Patient will benefit from skilled therapeutic intervention in order to improve the following deficits and impairments:  Improper body mechanics, Pain, Postural dysfunction, Decreased activity tolerance, Decreased endurance, Decreased range of motion, Decreased strength, Hypomobility, Impaired perceived functional ability, Impaired UE functional use, Impaired sensation  Visit Diagnosis: Cervicalgia  Radiculopathy, cervical region  Muscle weakness (generalized)  Pain in right elbow     Problem List There are no problems to display for this patient.   Everlean Alstrom. Graylon Good, PT, DPT 09/01/21, 10:19 AM   [facility] [address] [place], [address] Phone: [phone]   Fax:  [phone]  Name: Charlyne Robertshaw MRN: [id number] Date of Birth: Jan 30, 1961

## 2021-10-17 ENCOUNTER — Other Ambulatory Visit: Payer: Self-pay | Admitting: Family Medicine

## 2021-10-17 DIAGNOSIS — Z1231 Encounter for screening mammogram for malignant neoplasm of breast: Secondary | ICD-10-CM

## 2021-10-23 ENCOUNTER — Emergency Department: Payer: BLUE CROSS/BLUE SHIELD

## 2021-10-23 ENCOUNTER — Other Ambulatory Visit: Payer: Self-pay

## 2021-10-23 ENCOUNTER — Emergency Department
Admission: EM | Admit: 2021-10-23 | Discharge: 2021-10-23 | Disposition: A | Discharge status: Home / Self Care | Payer: BLUE CROSS/BLUE SHIELD | Attending: Emergency Medicine | Admitting: Emergency Medicine

## 2021-10-23 DIAGNOSIS — K851 Biliary acute pancreatitis without necrosis or infection: Secondary | ICD-10-CM | POA: Diagnosis not present

## 2021-10-23 DIAGNOSIS — R739 Hyperglycemia, unspecified: Secondary | ICD-10-CM | POA: Insufficient documentation

## 2021-10-23 DIAGNOSIS — Z20822 Contact with and (suspected) exposure to covid-19: Secondary | ICD-10-CM | POA: Insufficient documentation

## 2021-10-23 DIAGNOSIS — R1011 Right upper quadrant pain: Secondary | ICD-10-CM | POA: Diagnosis present

## 2021-10-23 DIAGNOSIS — E785 Hyperlipidemia, unspecified: Secondary | ICD-10-CM | POA: Insufficient documentation

## 2021-10-23 LAB — BASIC METABOLIC PANEL
Anion gap: 10 (ref 5–15)
BUN: 17 mg/dL (ref 6–20)
CO2: 23 mmol/L (ref 22–32)
Calcium: 9.2 mg/dL (ref 8.9–10.3)
Chloride: 103 mmol/L (ref 98–111)
Creatinine, Ser: 0.62 mg/dL (ref 0.44–1.00)
GFR, Estimated: >60 mL/min (ref >60)
Glucose, Bld: 162 mg/dL — ABNORMAL HIGH (ref 70–99)
Potassium: 3.9 mmol/L (ref 3.5–5.1)
Sodium: 136 mmol/L (ref 135–145)

## 2021-10-23 LAB — HEPATIC FUNCTION PANEL
ALT: 66 U/L — ABNORMAL HIGH (ref 0–44)
AST: 125 U/L — ABNORMAL HIGH (ref 15–41)
Albumin: 3.9 g/dL (ref 3.5–5.0)
Alkaline Phosphatase: 104 U/L (ref 38–126)
Bilirubin, Direct: 0.3 mg/dL — ABNORMAL HIGH (ref 0.0–0.2)
Indirect Bilirubin: 0.5 mg/dL (ref 0.3–0.9)
Total Bilirubin: 0.8 mg/dL (ref 0.3–1.2)
Total Protein: 7 g/dL (ref 6.5–8.1)

## 2021-10-23 LAB — CBC
HCT: 40.1 % (ref 36.0–46.0)
Hemoglobin: 13.5 g/dL (ref 12.0–15.0)
MCH: 28.7 pg (ref 26.0–34.0)
MCHC: 33.7 g/dL (ref 30.0–36.0)
MCV: 85.3 fL (ref 80.0–100.0)
Platelets: 281 10*3/uL (ref 150–400)
RBC: 4.7 MIL/uL (ref 3.87–5.11)
RDW: 13.3 % (ref 11.5–15.5)
WBC: 10.8 10*3/uL — ABNORMAL HIGH (ref 4.0–10.5)
nRBC: 0 % (ref 0.0–0.2)

## 2021-10-23 LAB — TROPONIN I (HIGH SENSITIVITY)
Troponin I (High Sensitivity): 7 ng/L (ref <18)
Troponin I (High Sensitivity): 7 ng/L (ref <18)

## 2021-10-23 LAB — LIPID PANEL
Cholesterol: 268 mg/dL — ABNORMAL HIGH (ref 0–200)
HDL: 50 mg/dL (ref >40)
LDL Cholesterol: 177 mg/dL — ABNORMAL HIGH (ref 0–99)
Total CHOL/HDL Ratio: 5.4 RATIO
Triglycerides: 204 mg/dL — ABNORMAL HIGH (ref <150)
VLDL: 41 mg/dL — ABNORMAL HIGH (ref 0–40)

## 2021-10-23 LAB — RESP PANEL BY RT-PCR (FLU A&B, COVID) ARPGX2
Influenza A by PCR: NEGATIVE
Influenza B by PCR: NEGATIVE
SARS Coronavirus 2 by RT PCR: NEGATIVE

## 2021-10-23 LAB — LIPASE, BLOOD: Lipase: 591 U/L — ABNORMAL HIGH (ref 11–51)

## 2021-10-23 MED ORDER — MORPHINE SULFATE (PF) 4 MG/ML IV SOLN
4.0000 mg | Freq: Once | INTRAVENOUS | Status: AC
  Administered 2021-10-23: 4 mg via INTRAVENOUS
  Filled 2021-10-23: qty 1

## 2021-10-23 MED ORDER — SUCRALFATE 1 G PO TABS
1.0000 g | ORAL_TABLET | Freq: Once | ORAL | Status: AC
Start: 2021-10-23 — End: 2021-10-23
  Administered 2021-10-23: 1 g via ORAL
  Filled 2021-10-23: qty 1

## 2021-10-23 MED ORDER — IOHEXOL 300 MG/ML  SOLN
100.0000 mL | Freq: Once | INTRAMUSCULAR | Status: AC | PRN
  Administered 2021-10-23: 100 mL via INTRAVENOUS
  Filled 2021-10-23: qty 100

## 2021-10-23 MED ORDER — ALUM & MAG HYDROXIDE-SIMETH 200-200-20 MG/5ML PO SUSP
30.0000 mL | Freq: Once | ORAL | Status: AC
  Administered 2021-10-23: 30 mL via ORAL
  Filled 2021-10-23: qty 30

## 2021-10-23 MED ORDER — ONDANSETRON HCL 4 MG PO TABS: 4.0000 mg | ORAL_TABLET | Freq: Three times a day (TID) | ORAL | 0 refills | Status: DC | PRN

## 2021-10-23 MED ORDER — PANTOPRAZOLE SODIUM 40 MG PO TBEC
40.0000 mg | DELAYED_RELEASE_TABLET | Freq: Once | ORAL | Status: AC
Start: 2021-10-23 — End: 2021-10-23
  Administered 2021-10-23: 40 mg via ORAL
  Filled 2021-10-23: qty 1

## 2021-10-23 MED ORDER — OXYCODONE-ACETAMINOPHEN 5-325 MG PO TABS: 1.0000 | ORAL_TABLET | Freq: Three times a day (TID) | ORAL | 0 refills | Status: AC | PRN

## 2021-10-23 MED ORDER — LACTATED RINGERS IV BOLUS
1000.0000 mL | Freq: Once | INTRAVENOUS | Status: AC
  Administered 2021-10-23: 1000 mL via INTRAVENOUS

## 2021-10-23 NOTE — ED Notes (Signed)
Per Dr. Katrinka Blazing, no repeat troponin needed.

## 2021-10-23 NOTE — ED Notes (Signed)
U/S tech at BS to perform U/S. °

## 2021-10-23 NOTE — ED Triage Notes (Signed)
Pt to ED for epigastric pain that started at 830 this am. Denies n/v/d Hx acid reflux

## 2021-10-23 NOTE — ED Provider Notes (Addendum)
[facility] Provider Note    Event Date/Time   First MD Initiated Contact with Patient 10/23/21 1228     (approximate)   History   Abdominal Pain   HPI  Jessica Holden is a 61 y.o. female with a past medical history of GERD on Protonix and intermittent ibuprofen use who presents for assessment of some epigastric and right upper quadrant pain that started this morning around 8 AM.  No prior similar episodes.  No clear alleviating or rating factors.  Patient denies any lower abdominal pain, back pain, chest pain, cough, fevers, shortness of breath, vomiting, diarrhea, urinary symptoms, rash or extremity pain.  She denies regular EtOH use or any other acute concerns at this time.      Physical Exam  Triage Vital Signs: ED Triage Vitals  Enc Vitals Group     BP 10/23/21 1055 121/87     Pulse Rate 10/23/21 1055 84     Resp 10/23/21 1055 20     Temp 10/23/21 1055 98.4 F (36.9 C)     Temp Source 10/23/21 1055 Oral     SpO2 10/23/21 1055 97 %     Weight 10/23/21 1101 180 lb (81.6 kg)     Height 10/23/21 1101 5' 4"  (1.626 m)     Head Circumference --      Peak Flow --      Pain Score 10/23/21 1101 8     Pain Loc --      Pain Edu? --      Excl. in [place]? --     Most recent vital signs: Vitals:   10/23/21 1630 10/23/21 1700  BP: 121/68 123/72  Pulse: 73 73  Resp:  19  Temp:  98.4 F (36.9 C)  SpO2: 98% 97%    General: Awake, no distress.  CV:  Good peripheral perfusion.  Resp:  Normal effort.  Abd:  No distention.  Mild tenderness in the epigastrium .  Remainder of abdomen is soft nontender throughout.  No CVA tenderness    ED Results / Procedures / Treatments  Labs (all labs ordered are listed, but only abnormal results are displayed) Labs Reviewed  BASIC METABOLIC PANEL - Abnormal; Notable for the following components:      Result Value   Glucose, Bld 162 (*)    All other components within normal limits  CBC - Abnormal; Notable for  the following components:   WBC 10.8 (*)    All other components within normal limits  HEPATIC FUNCTION PANEL - Abnormal; Notable for the following components:   AST 125 (*)    ALT 66 (*)    Bilirubin, Direct 0.3 (*)    All other components within normal limits  LIPASE, BLOOD - Abnormal; Notable for the following components:   Lipase 591 (*)    All other components within normal limits  LIPID PANEL - Abnormal; Notable for the following components:   Cholesterol 268 (*)    Triglycerides 204 (*)    VLDL 41 (*)    LDL Cholesterol 177 (*)    All other components within normal limits  RESP PANEL BY RT-PCR (FLU A&B, COVID) ARPGX2  TROPONIN I (HIGH SENSITIVITY)  TROPONIN I (HIGH SENSITIVITY)     EKG  EKG reviewed by myself shows sinus rhythm with ventricular rate of 82, normal axis, unremarkable intervals without evidence of acute ischemia or significant arrhythmia.  RADIOLOGY  Chest x-ray reviewed by myself shows no focal consolidation, effusion, edema, pneumothorax  or other clear acute thoracic process.  Also reviewed radiologist interpretation and agree with their findings.  Right upper quadrant ultrasound reviewed by myself shows cholelithiasis but no evidence of gallbladder thickening or other sequelae of cholecystitis.  Common bile duct diameter is normal.  I also reviewed radiology's findings and agree with her interpretation.  CT abdomen pelvis ordered and reviewed by myself shows gallstones and what appears to be slightly increased thickening compared to the ultrasound but no dilation of the common bile duct.  There is little bit of inflammatory stranding around the pancreas and a tiny Hetal hernia.  No appendicitis, kidney stone, diverticulitis or other acute abdominal or pelvic process.  Also reviewed radiology's findings and agree with her interpretation.  PROCEDURES:  Critical Care performed: No  Procedures \   MEDICATIONS ORDERED IN ED: Medications  alum & mag  hydroxide-simeth (MAALOX/MYLANTA) 200-200-20 MG/5ML suspension 30 mL (30 mLs Oral Given 10/23/21 1302)  pantoprazole (PROTONIX) EC tablet 40 mg (40 mg Oral Given 10/23/21 1302)  sucralfate (CARAFATE) tablet 1 g (1 g Oral Given 10/23/21 1301)  morphine 4 MG/ML injection 4 mg (4 mg Intravenous Given 10/23/21 1419)  lactated ringers bolus 1,000 mL (0 mLs Intravenous Stopped 10/23/21 1703)  iohexol (OMNIPAQUE) 300 MG/ML solution 100 mL (100 mLs Intravenous Contrast Given 10/23/21 1535)     IMPRESSION / MDM / ASSESSMENT AND PLAN / ED COURSE  I reviewed the triage vital signs and the nursing notes.                              Differential diagnosis includes, but is not limited to atypical ACS, GERD, gastritis, peptic ulcer disease, pancreatitis, cholecystitis, and possibly symptomatic cholelithiasis.  EKG reviewed by myself shows sinus rhythm with ventricular rate of 82, normal axis, unremarkable intervals without evidence of acute ischemia or significant arrhythmia.  Troponin nonelevated x2 is not suggestive of atypical presentation for ACS  Chest x-ray reviewed by myself shows no focal consolidation, effusion, edema, pneumothorax or other clear acute thoracic process.  Also reviewed radiologist interpretation and agree with their findings.  Right upper quadrant ultrasound reviewed by myself shows cholelithiasis but no evidence of gallbladder thickening or other sequelae of cholecystitis.  Common bile duct diameter is normal.  I also reviewed radiology's findings and agree with her interpretation.  CT abdomen pelvis ordered and reviewed by myself shows gallstones and what appears to be slightly increased thickening compared to the ultrasound but no dilation of the common bile duct.  There is little bit of inflammatory stranding around the pancreas and a tiny Hetal hernia.  No appendicitis, kidney stone, diverticulitis or other acute abdominal or pelvic process.  Also reviewed radiology's findings and  agree with her interpretation.  CBC remarkable for WBC count of 10.8 with normal hemoglobin and platelets.  BMP remarkable for glucose of 162 without any other significant electrolyte or metabolic derangements.  Hepatic function panel shows an AST of 125 and ALT of 66 with normal bilirubin and alk phos.  Not suggestive of acute cholestatic process.  Lipase consistent with pancreatitis at 591.  COVID influenza PCR is negative.  Overall picture is concerning for likely gallstone pancreatitis or a passed gallstone.  I have a low suspicion for retained stone given absence of elevation of alk phos, dilation of the common bile duct and normal bilirubin.  Patient on my exam has no tenderness in the right upper quadrant or Murphy sign but is only  tender in the epigastrium.  I suspect this is from the pancreatitis.  Lower suspicion at this time for cholecystitis.  I discussed this with on-call surgeon Dr. Hampton Abbot who agrees with this assessment and given pain is well controlled on reassessment agreed that discharge with close outpatient follow-up is reasonable.  An extensive discussion with patient at bedside regarding this and she will go to surgery clinic on Monday to have her labs rechecked and be evaluated for possible elective cholecystectomy.  Lipid panel shows some hypercholesterolemia but not to the degree that would likely explain patient's pancreatitis.  Advised her to follow-up with her PCP regards to this in addition to the elevated blood sugars..  I discussed returning immediately for any new or worsening of symptoms.  Discharged in stable condition.  Strict return precautions advised and discussed.     FINAL CLINICAL IMPRESSION(S) / ED DIAGNOSES   Final diagnoses:  RUQ abdominal pain  Gallstone pancreatitis  Hyperglycemia  Hyperlipidemia, unspecified hyperlipidemia type     Rx / DC Orders   ED Discharge Orders          Ordered    oxyCODONE-acetaminophen (PERCOCET) 5-325 MG tablet  Every 8  hours PRN        10/23/21 1650    ondansetron (ZOFRAN) 4 MG tablet  Every 8 hours PRN        10/23/21 1650             Note:  This document was prepared using Dragon voice recognition software and may include unintentional dictation errors.   Lucrezia Starch, MD 10/23/21 1651    Lucrezia Starch, MD 10/23/21 [phone]

## 2021-10-23 NOTE — ED Notes (Signed)
Pt states that she was awakened this am with epigastric pain that radiated to the right upper abd, into her chest and back. Pt states that it made her nauseated and that she broke out into a sweat, pt states that she ate baked chicken with cumin on it and sometimes she is sensitive to that, pt reports a similar episode a couple weeks ago and is concerned that her daughter recently had her appendix removed

## 2021-10-23 NOTE — ED Triage Notes (Signed)
Interpreter requested 

## 2021-10-24 ENCOUNTER — Telehealth: Payer: Self-pay

## 2021-10-24 NOTE — Telephone Encounter (Signed)
Message left for the patient via Interpreter services that we were calling her about her recent ER visit and problems with her gallbladder. We would like to get her scheduled with Dr Hampton Abbot for a visit to talk about this and possibly schedule surgery.  If she calls back we can call Interpreter services to help with interpretation.

## 2021-10-29 ENCOUNTER — Ambulatory Visit: Payer: BLUE CROSS/BLUE SHIELD | Admitting: Surgery

## 2021-10-29 ENCOUNTER — Other Ambulatory Visit: Payer: Self-pay

## 2021-10-29 ENCOUNTER — Encounter: Payer: Self-pay | Admitting: Surgery

## 2021-10-29 ENCOUNTER — Telehealth: Payer: Self-pay

## 2021-10-29 ENCOUNTER — Other Ambulatory Visit
Admission: RE | Admit: 2021-10-29 | Discharge: 2021-10-29 | Disposition: A | Discharge status: Home / Self Care | Payer: BLUE CROSS/BLUE SHIELD | Source: Ambulatory Visit | Attending: Surgery | Admitting: Surgery

## 2021-10-29 VITALS — BP 124/81 | HR 74 | Temp 98.3°F | Ht 61.0 in | Wt 166.0 lb

## 2021-10-29 DIAGNOSIS — R1011 Right upper quadrant pain: Secondary | ICD-10-CM | POA: Diagnosis present

## 2021-10-29 DIAGNOSIS — K851 Biliary acute pancreatitis without necrosis or infection: Secondary | ICD-10-CM | POA: Diagnosis present

## 2021-10-29 LAB — COMPREHENSIVE METABOLIC PANEL
ALT: 69 U/L — ABNORMAL HIGH (ref 0–44)
AST: 34 U/L (ref 15–41)
Albumin: 3.8 g/dL (ref 3.5–5.0)
Alkaline Phosphatase: 98 U/L (ref 38–126)
Anion gap: 8 (ref 5–15)
BUN: 9 mg/dL (ref 6–20)
CO2: 25 mmol/L (ref 22–32)
Calcium: 9.2 mg/dL (ref 8.9–10.3)
Chloride: 106 mmol/L (ref 98–111)
Creatinine, Ser: 0.56 mg/dL (ref 0.44–1.00)
GFR, Estimated: >60 mL/min (ref >60)
Glucose, Bld: 93 mg/dL (ref 70–99)
Potassium: 3.8 mmol/L (ref 3.5–5.1)
Sodium: 139 mmol/L (ref 135–145)
Total Bilirubin: 0.5 mg/dL (ref 0.3–1.2)
Total Protein: 7 g/dL (ref 6.5–8.1)

## 2021-10-29 LAB — LIPASE, BLOOD: Lipase: 30 U/L (ref 11–51)

## 2021-10-29 NOTE — Telephone Encounter (Signed)
Spoke with patient via Interpreter services and let her know that Dr Aleen Campi wanted her to have some lab work done today before she is seen for her appointment. She is aware and will have her son driver her to [facility] to have this done.

## 2021-10-29 NOTE — H&P (View-Only) (Signed)
10/29/2021  Reason for Visit:  Gallstone pancreatitis  History of Present Illness: Jessica Holden is a 61 y.o. female presenting for evaluation of gallstone pancreatitis. She presented to the ED on 10/23/21 with RUQ and epigastric pain x 1 day.  She reported that the pain was radiating to the back as well.  She was found to have elevated lipase to 591, elevated AST/ALT of 125/66, but normal total bili of 0.8, WBC of 10.8.  Her initial workup was with CT scan which showed cholelithiasis with possible stranding, with no pancreatic inflammatory changes.  U/S was then done which showed cholelithiasis without wall thickening or pericholecystic fluid, normal CBD diameter, and negative Murphy's sign.  On evaluation, her abdominal exam had improved and after further discussion, was discharged to home with close follow up.  Today, she reports that she's had very minimal issues since leaving the ED.  She's been tolerating a diet without nausea or worsening pain. She did have one milder episode of discomfort, but seems to have more discomfort in the left upper quadrant.  I repeated labs prior to today's visit showing a normal lipase of 30, normal AST of 34, ALT 69, and normal total bili of 0.5.    Past Medical History: Past Medical History:  Diagnosis Date   Gallstone pancreatitis    Hyperlipidemia      Past Surgical History: Past Surgical History:  Procedure Laterality Date   ARTHROSCOPIC ROTATOR CUFF REPAIR Right 10/19/2011    Home Medications: Prior to Admission medications   Medication Sig Start Date End Date Taking? Authorizing Provider  cetirizine (ZYRTEC) 10 MG tablet Take 10 mg by mouth daily.   Yes [provider]  ondansetron (ZOFRAN) 4 MG tablet Take 1 tablet (4 mg total) by mouth every 8 (eight) hours as needed for up to 10 doses for nausea or vomiting. 10/23/21  Yes Lucrezia Starch, MD  acetaminophen (TYLENOL) 500 MG tablet Take 1,000 mg by mouth every 6 (six) hours as  needed for moderate pain.    [provider]  omeprazole (PRILOSEC) 20 MG capsule Take 20 mg by mouth daily. 10/16/21   [provider]  oxyCODONE-acetaminophen (PERCOCET) 5-325 MG tablet Take 1 tablet by mouth every 8 (eight) hours as needed for up to 5 days for severe pain. 10/23/21 10/29/21  Lucrezia Starch, MD    Allergies: No Known Allergies  Social History:  reports that she has never smoked. She has never used smokeless tobacco. She reports that she does not drink alcohol and does not use drugs.   Family History: Family History  Problem Relation Age of Onset   Breast cancer Neg Hx     Review of Systems: Review of Systems  Constitutional:  Negative for chills and fever.  HENT:  Negative for hearing loss.   Respiratory:  Negative for shortness of breath.   Cardiovascular:  Negative for chest pain.  Gastrointestinal:  Positive for abdominal pain. Negative for constipation, diarrhea, nausea and vomiting.  Genitourinary:  Negative for dysuria.  Musculoskeletal:  Negative for myalgias.  Skin:  Negative for rash.  Neurological:  Negative for dizziness.  Psychiatric/Behavioral:  Negative for depression.    Physical Exam BP 124/81    Pulse 74    Temp 98.3 F (36.8 C)    Ht 5' 1"  (1.549 m)    Wt 166 lb (75.3 kg)    SpO2 97%    BMI 31.37 kg/m  CONSTITUTIONAL: No acute distress, well nourished. HEENT:  Normocephalic, atraumatic, extraocular  motion intact. NECK: Trachea is midline, and there is no jugular venous distension.  RESPIRATORY:  Lungs are clear, and breath sounds are equal bilaterally. Normal respiratory effort without pathologic use of accessory muscles. CARDIOVASCULAR: Heart is regular without murmurs, gallops, or rubs. GI: The abdomen is soft, non-distended, with minimal discomfort in the epigastric area.  None in the right or left upper quadrants.  MUSCULOSKELETAL:  Normal muscle strength and tone in all four extremities.  No peripheral edema or  cyanosis. SKIN: Skin turgor is normal. There are no pathologic skin lesions.  NEUROLOGIC:  Motor and sensation is grossly normal.  Cranial nerves are grossly intact. PSYCH:  Alert and oriented to person, place and time. Affect is normal.  Laboratory Analysis: Labs from 10/23/21: Na 136, K 3.9, Cl 103, CO2 23, BUN 17, Cr 0.62.  Total bili 0.8, AST 125, ALT 66, Alk Phos 104, lipase 591.  WBC 10.8, Hgb 13.5, hct 40.1, Plt 281.  Labs from 10/29/21: Na 139, K 3.8, Cl 106, CO2 25, BUN 9, Cr 0.56.  Total bili 0.5, AST 34, ALT 69, Alk Phos 98, lipase 30.   Imaging: CT scan abdomen/pelvis 10/23/21: IMPRESSION: 1. Cholelithiasis with mild gallbladder wall thickening and pericholecystic fat stranding. Correlate for possible acute calculus cholecystitis. 2. Tiny hiatal hernia.  Ultrasound RUQ 10/23/21: IMPRESSION: Cholecystolithiasis. Otherwise unremarkable right upper quadrant ultrasound, as described.   Assessment and Plan: This is a 61 y.o. female with episode of gallstone pancreatitis.    --Discussed with the patient the function of the gallbladder and how stones from the gallbladder could have passed into the common bile duct and affect her pancreas.  On CT scan, there was no pancreatic inflammatory changes, so this was a very mild episode.  CBD diameter was also normal, and total bilirubin was normal.  Her exam improved in the ED and she was appropriate for discharge home. --Discussed with her that having the gallbladder puts her at risk of either episodes of biliary colic / cholecystitis or possibly further gallstone pancreatitis, and that unfortunately those episodes could be more severe.  The recommendation would be to proceed with cholecystectomy to prevent further issues.  She's in agreement. --Discussed with her the role for a robotic assisted cholecystectomy with ICG imaging.  Reviewed with her the surgery at length, including the risks of bleeding, infection, injury to surrounding  structures, use of ICG for better evaluation of the biliary anatomy, post-operative activity restrictions and recovery, and she's willing to proceed. --Will schedule her for surgery on 11/05/21.  I spent 60 minutes dedicated to the care of this patient on the date of this encounter to include pre-visit review of records, face-to-face time with the patient discussing diagnosis and management, and any post-visit coordination of care.   Melvyn Neth, [facility]

## 2021-10-29 NOTE — Progress Notes (Signed)
10/29/2021  Reason for Visit:  Gallstone pancreatitis  History of Present Illness: Jessica Holden is a 61 y.o. female presenting for evaluation of gallstone pancreatitis. She presented to the ED on 10/23/21 with RUQ and epigastric pain x 1 day.  She reported that the pain was radiating to the back as well.  She was found to have elevated lipase to 591, elevated AST/ALT of 125/66, but normal total bili of 0.8, WBC of 10.8.  Her initial workup was with CT scan which showed cholelithiasis with possible stranding, with no pancreatic inflammatory changes.  U/S was then done which showed cholelithiasis without wall thickening or pericholecystic fluid, normal CBD diameter, and negative Murphy's sign.  On evaluation, her abdominal exam had improved and after further discussion, was discharged to home with close follow up.  Today, she reports that she's had very minimal issues since leaving the ED.  She's been tolerating a diet without nausea or worsening pain. She did have one milder episode of discomfort, but seems to have more discomfort in the left upper quadrant.  I repeated labs prior to today's visit showing a normal lipase of 30, normal AST of 34, ALT 69, and normal total bili of 0.5.    Past Medical History: Past Medical History:  Diagnosis Date   Gallstone pancreatitis    Hyperlipidemia      Past Surgical History: Past Surgical History:  Procedure Laterality Date   ARTHROSCOPIC ROTATOR CUFF REPAIR Right 10/19/2011    Home Medications: Prior to Admission medications   Medication Sig Start Date End Date Taking? Authorizing Provider  cetirizine (ZYRTEC) 10 MG tablet Take 10 mg by mouth daily.   Yes [provider]  ondansetron (ZOFRAN) 4 MG tablet Take 1 tablet (4 mg total) by mouth every 8 (eight) hours as needed for up to 10 doses for nausea or vomiting. 10/23/21  Yes Lucrezia Starch, MD  acetaminophen (TYLENOL) 500 MG tablet Take 1,000 mg by mouth every 6 (six) hours as  needed for moderate pain.    [provider]  omeprazole (PRILOSEC) 20 MG capsule Take 20 mg by mouth daily. 10/16/21   [provider]  oxyCODONE-acetaminophen (PERCOCET) 5-325 MG tablet Take 1 tablet by mouth every 8 (eight) hours as needed for up to 5 days for severe pain. 10/23/21 10/29/21  Lucrezia Starch, MD    Allergies: No Known Allergies  Social History:  reports that she has never smoked. She has never used smokeless tobacco. She reports that she does not drink alcohol and does not use drugs.   Family History: Family History  Problem Relation Age of Onset   Breast cancer Neg Hx     Review of Systems: Review of Systems  Constitutional:  Negative for chills and fever.  HENT:  Negative for hearing loss.   Respiratory:  Negative for shortness of breath.   Cardiovascular:  Negative for chest pain.  Gastrointestinal:  Positive for abdominal pain. Negative for constipation, diarrhea, nausea and vomiting.  Genitourinary:  Negative for dysuria.  Musculoskeletal:  Negative for myalgias.  Skin:  Negative for rash.  Neurological:  Negative for dizziness.  Psychiatric/Behavioral:  Negative for depression.    Physical Exam BP 124/81    Pulse 74    Temp 98.3 F (36.8 C)    Ht 5' 1"  (1.549 m)    Wt 166 lb (75.3 kg)    SpO2 97%    BMI 31.37 kg/m  CONSTITUTIONAL: No acute distress, well nourished. HEENT:  Normocephalic, atraumatic, extraocular  motion intact. NECK: Trachea is midline, and there is no jugular venous distension.  RESPIRATORY:  Lungs are clear, and breath sounds are equal bilaterally. Normal respiratory effort without pathologic use of accessory muscles. CARDIOVASCULAR: Heart is regular without murmurs, gallops, or rubs. GI: The abdomen is soft, non-distended, with minimal discomfort in the epigastric area.  None in the right or left upper quadrants.  MUSCULOSKELETAL:  Normal muscle strength and tone in all four extremities.  No peripheral edema or  cyanosis. SKIN: Skin turgor is normal. There are no pathologic skin lesions.  NEUROLOGIC:  Motor and sensation is grossly normal.  Cranial nerves are grossly intact. PSYCH:  Alert and oriented to person, place and time. Affect is normal.  Laboratory Analysis: Labs from 10/23/21: Na 136, K 3.9, Cl 103, CO2 23, BUN 17, Cr 0.62.  Total bili 0.8, AST 125, ALT 66, Alk Phos 104, lipase 591.  WBC 10.8, Hgb 13.5, hct 40.1, Plt 281.  Labs from 10/29/21: Na 139, K 3.8, Cl 106, CO2 25, BUN 9, Cr 0.56.  Total bili 0.5, AST 34, ALT 69, Alk Phos 98, lipase 30.   Imaging: CT scan abdomen/pelvis 10/23/21: IMPRESSION: 1. Cholelithiasis with mild gallbladder wall thickening and pericholecystic fat stranding. Correlate for possible acute calculus cholecystitis. 2. Tiny hiatal hernia.  Ultrasound RUQ 10/23/21: IMPRESSION: Cholecystolithiasis. Otherwise unremarkable right upper quadrant ultrasound, as described.   Assessment and Plan: This is a 61 y.o. female with episode of gallstone pancreatitis.    --Discussed with the patient the function of the gallbladder and how stones from the gallbladder could have passed into the common bile duct and affect her pancreas.  On CT scan, there was no pancreatic inflammatory changes, so this was a very mild episode.  CBD diameter was also normal, and total bilirubin was normal.  Her exam improved in the ED and she was appropriate for discharge home. --Discussed with her that having the gallbladder puts her at risk of either episodes of biliary colic / cholecystitis or possibly further gallstone pancreatitis, and that unfortunately those episodes could be more severe.  The recommendation would be to proceed with cholecystectomy to prevent further issues.  She's in agreement. --Discussed with her the role for a robotic assisted cholecystectomy with ICG imaging.  Reviewed with her the surgery at length, including the risks of bleeding, infection, injury to surrounding  structures, use of ICG for better evaluation of the biliary anatomy, post-operative activity restrictions and recovery, and she's willing to proceed. --Will schedule her for surgery on 11/05/21.  I spent 60 minutes dedicated to the care of this patient on the date of this encounter to include pre-visit review of records, face-to-face time with the patient discussing diagnosis and management, and any post-visit coordination of care.   Melvyn Neth, [facility]

## 2021-10-29 NOTE — Patient Instructions (Addendum)
hemos hablado hoy sobre su operacin para extirparle la vescula biliar. esto se har en [facility] por el Dr. Aleen CampiPiscoya. consulte su hoja de ciruga Pink para obtener informacin. nuestro programador de cirugas lo llamar para confirmar la fecha de la ciruga y repasar ms informacin para la Azerbaijanciruga. siga el plan de alimentacin de la vescula biliar para ayudar a prevenir cualquier ataque de vescula biliar antes de la ciruga.  completaremos cualquier papeleo necesario de su trabajo. puede dejar esto o hacer que nos lo enven por fax. Annie Sablenuestro nmero de fax es [phone]   Colecistectoma mnimamente invasiva Minimally Invasive Cholecystectomy Una colecistectoma mnimamente invasiva es una ciruga que se realiza para extirpar la vescula biliar. La vescula biliar es un rgano que tiene forma de pera y se encuentra debajo del hgado, del lado derecho del cuerpo. La vescula biliar almacena bilis, un lquido que ayuda al organismo a digerir las grasas. La colecistectoma se realiza con frecuencia para tratar la inflamacin (irritacin e hinchazn) de la vescula biliar (colecistitis). Por lo general, esta afeccin se debe a una acumulacin de clculos biliares (colelitiasis) en la vescula biliar o al estancamiento del lquido de la vescula biliar a causa de que los clculos biliares se atascan en los conductos (tubos) y obstruyen el paso de la bilis. Esto puede producir inflamacin y Engineer, miningdolor. En los Illinois Tool Workscasos graves, podr ser Bangladeshnecesaria una ciruga de urgencia. Este procedimiento se realiza a travs de pequeas incisiones en el abdomen, en lugar de una incisin grande. Tambin se denomina ciruga laparoscpica. Se introduce un endoscopio delgado que tiene Secretary/administratoruna cmara (laparoscopio) a travs de una incisin. A travs de las otras incisiones, se introducen los instrumentos quirrgicos. En algunos casos, es posible que [place]una ciruga mnimamente invasiva deba cambiarse a una Azerbaijanciruga realizada a travs de una  incisin ms grande. Esta se denomina South Africaciruga abierta. Informe al mdico acerca de lo siguiente: Cualquier alergia que tenga. Todos los Chesapeake Energymedicamentos que Botswanausa, incluidos vitaminas, hierbas, gotas oftlmicas, cremas y [address]medicamentos de [address] Northventa libre. Problemas previos que usted o algn miembro de su familia hayan tenido con los anestsicos. Cualquier problema de la sangre que tenga. Cirugas a las que se haya sometido. Cualquier afeccin mdica que tenga. Si est embarazada o podra estarlo. Cules son los riesgos? En general, se trata de un procedimiento seguro. Sin embargo, pueden ocurrir complicaciones, por ejemplo: Infeccin. Sangrado. Reacciones alrgicas a los medicamentos. Daos a las estructuras o los rganos cercanos. Un clculo biliar que queda en el conducto biliar comn. El conducto coldoco transporta la bilis desde la vescula biliar hasta el intestino delgado. Una filtracin de bilis del hgado o del conducto qustico despus de que se extirpa la vescula biliar. Qu ocurre antes del procedimiento? Medicamentos Consulte al mdico si debe hacer o no lo siguiente: Multimedia programmerCambiar o suspender los medicamentos que [place]ausa habitualmente. Esto es muy importante si toma medicamentos para la diabetes o anticoagulantes. Tomar medicamentos como aspirina e ibuprofeno. Estos medicamentos pueden tener un efecto anticoagulante en la Kingstonsangre. No tome estos medicamentos a menos que el mdico se lo indique. Usar medicamentos de venta libre, vitaminas, hierbas y suplementos. Instrucciones generales Si va a marcharse a su casa inmediatamente despus del procedimiento, pdale a un adulto responsable que: Lo lleve a su casa desde el hospital o la clnica. No se le permitir conducir. Lo cuide durante el Sempra Energytiempo que le indiquen. No consuma ningn producto que contenga nicotina ni tabaco durante al Lowe's Companiesmenos las 4 semanas anteriores al procedimiento. Estos productos incluyen cigarrillos, tabaco para Theatre managermascar  y aparatos  de vapeo, como los Administrator, Civil Service. Si necesita ayuda para dejar de fumar, consulte al [employer] Express. Pregntele al mdico: Cmo se Forensic psychologist de la Leisure centre manager. Qu medidas se tomarn para evitar una infeccin. Pueden incluir: Rasurar el vello del lugar de la ciruga. Lavar la piel con un jabn antisptico. Recibir antibiticos. Qu ocurre durante el procedimiento?  Le colocarn una va intravenosa (i.v.) en una vena. Le administrarn uno de los siguientes medicamentos o ambos: Un medicamento para ayudar a Lexicographer (sedante). Un medicamento que lo har dormir (anestesia general). Su cirujano le har varias incisiones pequeas en el abdomen. El laparoscopio se introducir a travs de una de las pequeas incisiones. La cmara del laparoscopio enviar imgenes a un monitor que se encuentra en el quirfano. Esto permitir a su Pensions consultant del abdomen. Le inyectarn un gas en el abdomen. Esto expandir el abdomen para que el cirujano tenga ms lugar para Facilities manager. El resto del instrumental necesario para el procedimiento se introducir a travs de las otras incisiones. Se extirpar la vescula biliar a travs de una de las incisiones. Se puede examinar el conducto coldoco. Si se encuentran clculos en la va biliar, tal vez deban extirparse. Despus de la extirpacin de la vescula biliar, se cerrarn las incisiones con puntos (suturas), grapas o goma para cerrar la piel. Las incisiones pueden cubrirse con una venda (vendaje). El procedimiento puede variar segn el mdico y el hospital. Ladell Heads ocurre despus del procedimiento? Le controlarn la presin arterial, la frecuencia cardaca, la frecuencia respiratoria y Air cabin crew de oxgeno en la sangre hasta que le den el alta del hospital o la clnica. Le darn analgsicos para Human resources officer, si es necesario. Es posible que le coloquen un drenaje en la incisin. Se lo retirarn [employer]in despus del  procedimiento. Resumen La colecistectoma mnimamente invasiva, tambin llamada colecistectoma laparoscpica, es una ciruga que se realiza para extirpar la vescula biliar a travs de pequeas incisiones. Informe a su mdico sobre todas las otras afecciones que tenga y Spencer todos los medicamentos que est usando para dichas afecciones. Antes del procedimiento, siga las instrucciones sobre cundo dejar de comer y beber y Tacy Dura cambiar o suspender medicamentos. Haga que un adulto responsable lo cuide durante el tiempo que le indiquen despus de que le den el alta del hospital o de la clnica.  Plan de alimentacin para problemas de vescula biliar Gallbladder Eating Plan Si tiene una afeccin de la vescula biliar, puede tener problemas para digerir las grasas. Consumir una dieta con bajo contenido de grasas puede [employer]well sntomas, y puede ser beneficiosa antes y despus de [place] de extraccin de vescula biliar (colecistectoma). El mdico puede recomendarle que trabaje con un especialista en dietas y alimentacin (nutricionista) para que lo ayude a reducir la cantidad de grasas en su dieta. Consejos para seguir este plan [place] generales Limite el consumo de grasas a menos del 30 % del total de caloras diarias. Si usted ingiere alrededor de 1800 caloras diarias, esto es menos de 60 gramos (g) de Automotive engineer. La grasa es una parte importante de una dieta saludable. Consumir una dieta con bajo contenido de grasas puede dificultar mantener un peso corporal saludable. Pregunte a su nutricionista qu cantidad de grasas, caloras y otros nutrientes necesita diariamente. Haga comidas pequeas y frecuentes Freight forwarder de tres comidas abundantes. Beba de 8 a 10 vasos de lquido por [employer]. Beba suficiente lquido [employer]  para mantener la orina clara o de color amarillo plido. Limite el consumo de alcohol a no ms de 1 medida por da si es mujer y no est Orthoptist, y a  2 medidas por da si es hombre. Una medida equivale a 12 oz de cerveza, 5 oz de vino o 1 oz de bebidas alcohlicas de alta graduacin. Al leer las etiquetas de los alimentos  Consulte la informacin nutricional en las etiquetas de los alimentos para conocer la cantidad de grasas por porcin. Elija alimentos con menos de 3 gramos de grasas por porcin. Al ir de compras Elija alimentos saludables sin grasas o con bajo contenido de grasas. Busque las palabras sin grasa, bajo en grasas o con bajo contenido de [place]. Evite comprar alimentos procesados o preenvasados. Al cocinar Para cocinar opte por mtodos con bajo contenido de grasa, como hornear, hervir, Software engineer y Transport planner. Cocine con pequeas cantidades de grasas saludables, como aceite de [place], aceite de semilla de [place], aceite de canola o [place]. Qu alimentos se recomiendan? Todas las frutas y verduras frescas, congeladas o enlatadas. Cereales integrales. Leche y yogur semidescremados y descremados. Vita Barley, aves sin piel, pescado, huevos y legumbres. Suplementos proteicos con bajo contenido de grasas, en polvo o lquidos. Hierbas y especias. Qu alimentos no se recomiendan? Alimentos muy grasos. Entre estos se incluyen productos panificados, comida rpida, cortes de carne con grasa, helados, pan francs, rosquillas dulces, pizza, pan de queso, alimentos cubiertos con [place], salsas con crema o queso. Comidas fritas. Se incluyen papas fritas, tempura, pescado rebozado, milanesas de pollo, panes fritos y dulces. Alimentos con [employer]. Alimentos que causan gases o meteorismo. Resumen Una dieta de bajo contenido graso puede ser beneficiosa si tiene una afeccin de la vescula biliar o puede hacerla antes y despus de someterse a una ciruga de vescula. Limite el consumo de grasas a menos del 30 % del total de caloras diarias. Esto es casi 60 gramos de grasa si usted ingiere 1800 caloras diarias. Haga comidas pequeas y  frecuentes Freight forwarder de tres comidas abundantes. Esta informacin no tiene Theme park manager el consejo del mdico. Asegrese de hacerle al mdico cualquier pregunta que tenga. Document Revised: 07/17/2020 Document Reviewed: 07/17/2020 Elsevier Patient Education  2022 [employer].

## 2021-10-30 ENCOUNTER — Telehealth: Payer: Self-pay | Admitting: Surgery

## 2021-10-30 ENCOUNTER — Encounter: Payer: Self-pay | Admitting: Surgery

## 2021-10-30 NOTE — Telephone Encounter (Signed)
Patient is having robotic cholecystectomy done with Dr. Aleen Campi on 11/05/21.  Her employer is needing proof that she is having surgery done on 11/05/21 and will need to know in advance from [place] as to how long the patient will be out of work. Please call her when this note is ready for pick up.  You will  need language services. Thank you.

## 2021-10-30 NOTE — Telephone Encounter (Signed)
Outgoing call is made, used [employer], spoke with Magda Paganini, [place] [id number]  Patient has been advised of Pre-Admission date/time, COVID Testing date and Surgery date.  Surgery Date: 11/05/21 Preadmission Testing Date: 10/31/21 (phone 1p-5p) Covid Testing Date: Not needed.    Patient has been made aware to call [phone], between 1-3:00pm the day before surgery, to find out what time to arrive for surgery.

## 2021-10-30 NOTE — Telephone Encounter (Signed)
Spoke with the patient via interpreter services. Asked her about what her work is like and if they can accommodate lifting restrictions. She said they cannot accommodate lifting restrictions and she would need to be out of work for the entire four weeks. I let her know that was fine and that I would write up a letter for her supervisor to indicate this. I also let her know she could return sooner if her and Dr Aleen Campi are in agreement.  She will pick up her letter at the front desk today.

## 2021-10-31 ENCOUNTER — Other Ambulatory Visit
Admission: RE | Admit: 2021-10-31 | Discharge: 2021-10-31 | Disposition: A | Discharge status: Home / Self Care | Payer: BLUE CROSS/BLUE SHIELD | Source: Ambulatory Visit | Attending: Surgery | Admitting: Surgery

## 2021-10-31 ENCOUNTER — Other Ambulatory Visit: Payer: Self-pay

## 2021-10-31 HISTORY — DX: Prediabetes: R73.03

## 2021-10-31 NOTE — Patient Instructions (Signed)
Your procedure is scheduled on: Wednesday November 05, 2021. Su procedimiento est programado para: Miercoles 1 de Febrero del 2023. Report to Day Surgery inside [facility] 2nd floor stop by admissions desk first before getting on elevator. Presntese a: Diplomatic Services operational officerCirugia Ambulatoria dentro del [facility] 2ndo piso, registrese primero antes de subir al Auto-Owners Insuranceelevador. To find out your arrival time please call [phone] between 1PM - 3PM on Tuesday November 04, 2021. Para saber su hora de llegada por favor llame al [phone] entre la 1PM - 3PM el da: Martes 31 de Enero del 2023.  Remember: Instructions that are not followed completely may result in serious medical risk, up to and including death,  or upon the discretion of your surgeon and anesthesiologist your surgery may need to be rescheduled.  Recuerde: Las instrucciones que no se siguen completamente Armed forces logistics/support/administrative officerpueden resultar en un riesgo de salud grave, incluyendo hasta  la [place] o a discrecin de su cirujano y Scientific laboratory techniciananestesilogo, su ciruga se puede posponer.   __X_ 1.Do not eat food after midnight the night before your procedure. No    gum chewing or hard candies. You may drink clear liquids up to 2 hours     before you are scheduled to arrive for your surgery- DO not drink clear     Liquids within 2 hours of the start of your surgery.     Clear Liquids include:    water, apple juice without pulp, clear carbohydrate drink such as    Clearfast of Gartorade, Black Coffee or Tea (Do not add anything to coffee or tea).      No coma nada despus de la medianoche de la noche anterior a su    procedimiento. No coma chicles ni caramelos duros. Puede tomar    lquidos claros hasta 2 horas antes de su hora programada de llegada al     hospital para su procedimiento. No tome lquidos claros durante el     transcurso de las 2 horas de su llegada programada al hospital para su     procedimiento, ya que esto puede llevar a que su procedimiento se    retrase o  tenga que volver a Magazine features editorprogramarse.  Los lquidos claros incluyen:          - Agua o jugo de [place] sin pulpa          - Bebidas claras con carbohidratos como ClearFast o Gatorade          - Caf negro o t claro (sin leche, sin cremas, no agregue nada al caf ni al t)  No tome nada que no est en esta lista.  Los pacientes con diabetes tipo 1 y tipo 2 solo deben Printmakertomar agua.  Llame a la clnica de PreCare o a la unidad de Same Day Surgery si  tiene alguna pregunta sobre estas instrucciones.              _X__ 2.Do Not Smoke or use e-cigarettes For 24 Hours Prior to Your Surgery.    Do not use any chewable tobacco products for at least 6   hours prior to surgery.    No fume ni use cigarrillos electrnicos durante las 24 horas previas    a su [place].  No use ningn producto de tabaco masticable durante   al menos 6 horas antes de la [place].     __X_ 3. No alcohol for 24 hours before or after surgery.    No tome alcohol durante las 24 horas antes ni  despus de la ciruga.   __X__4. On the morning of surgery brush your teeth with toothpaste and water, you                may rinse your mouth with mouthwash if you wish.  Do not swallow any toothpaste of mouthwash.   En la maana de la [place], cepllese los dientes con pasta de dientes y [place],                [place] enjuagarse la boca con enjuague bucal si lo desea. No ingiera ninguna pasta de dientes o enjuague bucal.   __X__ 5. Notify your doctor if there is any change in your medical condition (cold,fever, infections).    Informe a su mdico si hay algn cambio en su condicin mdica  (resfriado, fiebre, infecciones).   Do not wear jewelry, make-up, hairpins, clips or nail polish.  No use joyas, maquillajes, pinzas/ganchos para el cabello ni esmalte de uas.  Do not wear lotions, powders, or perfumes. You may wear deodorant.  No use lociones, polvos o perfumes.  Puede usar desodorante.    Do not shave 48 hours prior to surgery. Men may  shave face and neck.  No se afeite 48 horas antes de la [place].  Los hombres pueden [employer] cara  y el cuello.   Do not bring valuables to the hospital.   No lleve objetos de valor al hospital.  [facility] is not responsible for any belongings or valuables.  [place] no se hace responsable de ningn tipo de pertenencias u objetos de Licensed conveyancer.               Contacts, dentures or bridgework may not be worn into surgery.  Los lentes de [place], las dentaduras postizas o puentes no se pueden usar en la [place].   Leave your suitcase in the car. After surgery it may be brought to your room.  Deje su maleta en el auto.  Despus de la ciruga podr traerla a su habitacin.   For patients admitted to the hospital, discharge time is determined by your  treatment team.  Para los pacientes que sean ingresados al hospital, el tiempo en el cual se le  dar de alta es determinado por su equipo de [place].   Patients discharged the day of surgery will not be allowed to drive home. A los pacientes que se les da de alta el mismo da de la ciruga no se les permitir conducir a Higher education careers adviser.   __X__ Take these medicines the morning of surgery with A SIP OF WATER:          [employer] estas medicinas la maana de la ciruga con UN SORBO DE AGUA:  1. omeprazole (PRILOSEC) 20 MG  2.   3.   4.       5.  6.  ____ Fleet Enema (as directed)          Enema de Fleet (segn lo indicado)    __X__ Use CHG Soap as directed          Utilice el jabn de CHG segn lo indicado  ____ Use inhalers on the day of surgery          Use los inhaladores el da de la ciruga  ____ Stop metformin 2 days prior to surgery          Deje de tomar el metformin 2 das antes de la ciruga    ____ Take 1/2 of usual insulin dose  the night before surgery and none on the morning of surgery           Tome la mitad de la dosis habitual de insulina la noche antes de la [place] y no tome nada en la maana de la              ciruga  __X__ Stop Anti-inflammatories such as Ibuprofen, Aleve, Advil, Motrin, naproxen, aspirin, Goody's and or BC powders.           Deje de tomar antiinflamatorios como Ibuprofen, Aleve, Advil, Motrin, naproxen, aspirin, Goody's and or BC powders.    __X__ Do not start any vitamins and or herbal supplements until after surgery            No empieze a tomar vitaminas o suplementos de hierbas hasta despus de la ciruga  ____ Bring C-Pap to the hospital          Lleve el C-Pap al hospital

## 2021-11-05 ENCOUNTER — Encounter: Payer: Self-pay | Admitting: Surgery

## 2021-11-05 ENCOUNTER — Encounter
Admission: RE | Disposition: A | Discharge status: Home / Self Care | Payer: Self-pay | Source: Home / Self Care | Attending: Surgery

## 2021-11-05 ENCOUNTER — Other Ambulatory Visit: Payer: Self-pay

## 2021-11-05 ENCOUNTER — Ambulatory Visit
Admission: RE | Admit: 2021-11-05 | Discharge: 2021-11-05 | Disposition: A | Discharge status: Home / Self Care | Payer: BLUE CROSS/BLUE SHIELD | Attending: Surgery | Admitting: Surgery

## 2021-11-05 ENCOUNTER — Ambulatory Visit: Payer: BLUE CROSS/BLUE SHIELD | Admitting: Certified Registered"

## 2021-11-05 DIAGNOSIS — E785 Hyperlipidemia, unspecified: Secondary | ICD-10-CM | POA: Insufficient documentation

## 2021-11-05 DIAGNOSIS — K851 Biliary acute pancreatitis without necrosis or infection: Secondary | ICD-10-CM | POA: Insufficient documentation

## 2021-11-05 DIAGNOSIS — K8010 Calculus of gallbladder with chronic cholecystitis without obstruction: Secondary | ICD-10-CM | POA: Diagnosis not present

## 2021-11-05 DIAGNOSIS — K429 Umbilical hernia without obstruction or gangrene: Secondary | ICD-10-CM | POA: Diagnosis not present

## 2021-11-05 DIAGNOSIS — R7303 Prediabetes: Secondary | ICD-10-CM | POA: Insufficient documentation

## 2021-11-05 HISTORY — PX: ROBOTIC ASSISTED LAPAROSCOPIC CHOLECYSTECTOMY: SHX6521

## 2021-11-05 HISTORY — PX: UMBILICAL HERNIA REPAIR: SHX196

## 2021-11-05 LAB — GLUCOSE, CAPILLARY: Glucose-Capillary: 170 mg/dL — ABNORMAL HIGH (ref 70–99)

## 2021-11-05 SURGERY — CHOLECYSTECTOMY, ROBOT-ASSISTED, LAPAROSCOPIC
Anesthesia: General | Site: Abdomen

## 2021-11-05 MED ORDER — 0.9 % SODIUM CHLORIDE (POUR BTL) OPTIME
TOPICAL | Status: DC | PRN
  Administered 2021-11-05: 10 mL

## 2021-11-05 MED ORDER — SUGAMMADEX SODIUM 500 MG/5ML IV SOLN
INTRAVENOUS | Status: DC | PRN
  Administered 2021-11-05: 300 mg via INTRAVENOUS

## 2021-11-05 MED ORDER — KETOROLAC TROMETHAMINE 30 MG/ML IJ SOLN
INTRAMUSCULAR | Status: DC | PRN
  Administered 2021-11-05: 15 mg via INTRAVENOUS

## 2021-11-05 MED ORDER — MIDAZOLAM HCL 2 MG/2ML IJ SOLN
INTRAMUSCULAR | Status: AC
  Filled 2021-11-05: qty 2

## 2021-11-05 MED ORDER — ORAL CARE MOUTH RINSE: 15.0000 mL | Freq: Once | OROMUCOSAL | Status: AC

## 2021-11-05 MED ORDER — PHENYLEPHRINE 40 MCG/ML (10ML) SYRINGE FOR IV PUSH (FOR BLOOD PRESSURE SUPPORT)
PREFILLED_SYRINGE | INTRAVENOUS | Status: DC | PRN
  Administered 2021-11-05: 80 ug via INTRAVENOUS
  Administered 2021-11-05: 160 ug via INTRAVENOUS

## 2021-11-05 MED ORDER — FENTANYL CITRATE (PF) 100 MCG/2ML IJ SOLN
25.0000 ug | INTRAMUSCULAR | Status: DC | PRN
  Administered 2021-11-05: 50 ug via INTRAVENOUS

## 2021-11-05 MED ORDER — ONDANSETRON HCL 4 MG/2ML IJ SOLN: 4.0000 mg | Freq: Once | INTRAMUSCULAR | Status: DC | PRN

## 2021-11-05 MED ORDER — ACETAMINOPHEN 500 MG PO TABS
ORAL_TABLET | ORAL | Status: AC
  Administered 2021-11-05: 1000 mg via ORAL
  Filled 2021-11-05: qty 2

## 2021-11-05 MED ORDER — CHLORHEXIDINE GLUCONATE CLOTH 2 % EX PADS: 6.0000 | MEDICATED_PAD | Freq: Once | CUTANEOUS | Status: DC

## 2021-11-05 MED ORDER — ACETAMINOPHEN 500 MG PO TABS: 1000.0000 mg | ORAL_TABLET | ORAL | Status: AC

## 2021-11-05 MED ORDER — OXYCODONE HCL 5 MG PO TABS: 5.0000 mg | ORAL_TABLET | ORAL | 0 refills | Status: DC | PRN

## 2021-11-05 MED ORDER — CEFAZOLIN SODIUM-DEXTROSE 2-4 GM/100ML-% IV SOLN
INTRAVENOUS | Status: AC
  Filled 2021-11-05: qty 100

## 2021-11-05 MED ORDER — ONDANSETRON HCL 4 MG/2ML IJ SOLN
INTRAMUSCULAR | Status: DC | PRN
  Administered 2021-11-05 (×2): 4 mg via INTRAVENOUS

## 2021-11-05 MED ORDER — INDOCYANINE GREEN 25 MG IV SOLR
2.5000 mg | INTRAVENOUS | Status: AC
  Administered 2021-11-05: 2.5 mg via INTRAVENOUS
  Filled 2021-11-05: qty 1

## 2021-11-05 MED ORDER — MEPERIDINE HCL 25 MG/ML IJ SOLN: 6.2500 mg | INTRAMUSCULAR | Status: DC | PRN

## 2021-11-05 MED ORDER — FENTANYL CITRATE (PF) 100 MCG/2ML IJ SOLN
INTRAMUSCULAR | Status: AC
  Filled 2021-11-05: qty 2

## 2021-11-05 MED ORDER — MIDAZOLAM HCL 2 MG/2ML IJ SOLN
INTRAMUSCULAR | Status: DC | PRN
  Administered 2021-11-05: 2 mg via INTRAVENOUS

## 2021-11-05 MED ORDER — GABAPENTIN 300 MG PO CAPS: 300.0000 mg | ORAL_CAPSULE | ORAL | Status: AC

## 2021-11-05 MED ORDER — IBUPROFEN 600 MG PO TABS: 600.0000 mg | ORAL_TABLET | Freq: Three times a day (TID) | ORAL | 1 refills | Status: AC | PRN

## 2021-11-05 MED ORDER — CHLORHEXIDINE GLUCONATE 0.12 % MT SOLN
OROMUCOSAL | Status: AC
  Administered 2021-11-05: 15 mL via OROMUCOSAL
  Filled 2021-11-05: qty 15

## 2021-11-05 MED ORDER — FENTANYL CITRATE (PF) 100 MCG/2ML IJ SOLN
INTRAMUSCULAR | Status: AC
  Administered 2021-11-05: 50 ug via INTRAVENOUS
  Filled 2021-11-05: qty 2

## 2021-11-05 MED ORDER — BUPIVACAINE-EPINEPHRINE (PF) 0.25% -1:200000 IJ SOLN
INTRAMUSCULAR | Status: AC
  Filled 2021-11-05: qty 30

## 2021-11-05 MED ORDER — PROPOFOL 10 MG/ML IV BOLUS
INTRAVENOUS | Status: AC
  Filled 2021-11-05: qty 20

## 2021-11-05 MED ORDER — ACETAMINOPHEN 10 MG/ML IV SOLN
INTRAVENOUS | Status: AC
  Filled 2021-11-05: qty 100

## 2021-11-05 MED ORDER — GABAPENTIN 300 MG PO CAPS
ORAL_CAPSULE | ORAL | Status: AC
  Administered 2021-11-05: 300 mg via ORAL
  Filled 2021-11-05: qty 1

## 2021-11-05 MED ORDER — BUPIVACAINE-EPINEPHRINE (PF) 0.25% -1:200000 IJ SOLN
INTRAMUSCULAR | Status: DC | PRN
  Administered 2021-11-05: 30 mL

## 2021-11-05 MED ORDER — FENTANYL CITRATE (PF) 100 MCG/2ML IJ SOLN
INTRAMUSCULAR | Status: DC | PRN
  Administered 2021-11-05 (×3): 50 ug via INTRAVENOUS

## 2021-11-05 MED ORDER — LACTATED RINGERS IV SOLN: INTRAVENOUS | Status: DC

## 2021-11-05 MED ORDER — ROCURONIUM BROMIDE 100 MG/10ML IV SOLN
INTRAVENOUS | Status: DC | PRN
  Administered 2021-11-05: 40 mg via INTRAVENOUS
  Administered 2021-11-05: 50 mg via INTRAVENOUS
  Administered 2021-11-05: 10 mg via INTRAVENOUS

## 2021-11-05 MED ORDER — CEFAZOLIN SODIUM-DEXTROSE 2-4 GM/100ML-% IV SOLN
2.0000 g | INTRAVENOUS | Status: AC
  Administered 2021-11-05: 2 g via INTRAVENOUS

## 2021-11-05 MED ORDER — LIDOCAINE HCL (CARDIAC) PF 100 MG/5ML IV SOSY
PREFILLED_SYRINGE | INTRAVENOUS | Status: DC | PRN
  Administered 2021-11-05: 80 mg via INTRAVENOUS

## 2021-11-05 MED ORDER — SEVOFLURANE IN SOLN
RESPIRATORY_TRACT | Status: AC
  Filled 2021-11-05: qty 250

## 2021-11-05 MED ORDER — PROPOFOL 10 MG/ML IV BOLUS
INTRAVENOUS | Status: DC | PRN
  Administered 2021-11-05: 150 mg via INTRAVENOUS

## 2021-11-05 MED ORDER — BUPIVACAINE HCL (PF) 0.5 % IJ SOLN
INTRAMUSCULAR | Status: AC
  Filled 2021-11-05: qty 30

## 2021-11-05 MED ORDER — CHLORHEXIDINE GLUCONATE 0.12 % MT SOLN: 15.0000 mL | Freq: Once | OROMUCOSAL | Status: AC

## 2021-11-05 MED ORDER — DEXAMETHASONE SODIUM PHOSPHATE 10 MG/ML IJ SOLN
INTRAMUSCULAR | Status: DC | PRN
  Administered 2021-11-05: 10 mg via INTRAVENOUS

## 2021-11-05 SURGICAL SUPPLY — 54 items
ADH SKN CLS APL DERMABOND .7 (GAUZE/BANDAGES/DRESSINGS) ×1
BAG INFUSER PRESSURE 100CC (MISCELLANEOUS) IMPLANT
BAG SPEC RTRVL LRG 6X4 10 (ENDOMECHANICALS) ×1
CANNULA REDUC XI 12-8 STAPL (CANNULA) ×1
CANNULA REDUCER 12-8 DVNC XI (CANNULA) ×1 IMPLANT
CLIP LIGATING HEMO O LOK GREEN (MISCELLANEOUS) ×2 IMPLANT
COVER LIGHT HANDLE STERIS (MISCELLANEOUS) ×1 IMPLANT
DERMABOND ADVANCED (GAUZE/BANDAGES/DRESSINGS) ×1
DERMABOND ADVANCED .7 DNX12 (GAUZE/BANDAGES/DRESSINGS) ×1 IMPLANT
DRAPE ARM DVNC X/XI (DISPOSABLE) ×4 IMPLANT
DRAPE COLUMN DVNC XI (DISPOSABLE) ×1 IMPLANT
DRAPE DA VINCI XI ARM (DISPOSABLE) ×4
DRAPE DA VINCI XI COLUMN (DISPOSABLE) ×1
ELECT CAUTERY BLADE TIP 2.5 (TIP) ×2
ELECT REM PT RETURN 9FT ADLT (ELECTROSURGICAL) ×2
ELECTRODE CAUTERY BLDE TIP 2.5 (TIP) ×1 IMPLANT
ELECTRODE REM PT RTRN 9FT ADLT (ELECTROSURGICAL) ×1 IMPLANT
GLOVE SURG SYN 7.0 (GLOVE) ×4 IMPLANT
GLOVE SURG SYN 7.0 PF PI (GLOVE) ×2 IMPLANT
GLOVE SURG SYN 7.5  E (GLOVE) ×2
GLOVE SURG SYN 7.5 E (GLOVE) ×2 IMPLANT
GLOVE SURG SYN 7.5 PF PI (GLOVE) ×2 IMPLANT
GOWN STRL REUS W/ TWL LRG LVL3 (GOWN DISPOSABLE) ×4 IMPLANT
GOWN STRL REUS W/TWL LRG LVL3 (GOWN DISPOSABLE) ×8
IV NS 1000ML (IV SOLUTION)
IV NS 1000ML BAXH (IV SOLUTION) IMPLANT
KIT PINK PAD W/HEAD ARE REST (MISCELLANEOUS) ×2
KIT PINK PAD W/HEAD ARM REST (MISCELLANEOUS) ×1 IMPLANT
LABEL OR SOLS (LABEL) ×2 IMPLANT
MANIFOLD NEPTUNE II (INSTRUMENTS) ×2 IMPLANT
NEEDLE HYPO 22GX1.5 SAFETY (NEEDLE) ×2 IMPLANT
NS IRRIG 500ML POUR BTL (IV SOLUTION) ×2 IMPLANT
OBTURATOR OPTICAL STANDARD 8MM (TROCAR) ×1
OBTURATOR OPTICAL STND 8 DVNC (TROCAR) ×1
OBTURATOR OPTICALSTD 8 DVNC (TROCAR) ×1 IMPLANT
PACK LAP CHOLECYSTECTOMY (MISCELLANEOUS) ×2 IMPLANT
PENCIL ELECTRO HAND CTR (MISCELLANEOUS) ×2 IMPLANT
POUCH SPECIMEN RETRIEVAL 10MM (ENDOMECHANICALS) ×2 IMPLANT
SEAL CANN UNIV 5-8 DVNC XI (MISCELLANEOUS) ×3 IMPLANT
SEAL XI 5MM-8MM UNIVERSAL (MISCELLANEOUS) ×3
SET TUBE SMOKE EVAC HIGH FLOW (TUBING) ×2 IMPLANT
SOLUTION ELECTROLUBE (MISCELLANEOUS) ×2 IMPLANT
SPONGE T-LAP 18X18 ~~LOC~~+RFID (SPONGE) IMPLANT
SPONGE T-LAP 4X18 ~~LOC~~+RFID (SPONGE) ×2 IMPLANT
STAPLER CANNULA SEAL DVNC XI (STAPLE) ×1 IMPLANT
STAPLER CANNULA SEAL XI (STAPLE) ×1
SUT MNCRL AB 4-0 PS2 18 (SUTURE) ×2 IMPLANT
SUT VIC AB 2-0 SH 27 (SUTURE) ×2
SUT VIC AB 2-0 SH 27XBRD (SUTURE) IMPLANT
SUT VIC AB 3-0 SH 27 (SUTURE) ×2
SUT VIC AB 3-0 SH 27X BRD (SUTURE) IMPLANT
SUT VICRYL 0 AB UR-6 (SUTURE) ×4 IMPLANT
TAPE TRANSPORE STRL 2 31045 (GAUZE/BANDAGES/DRESSINGS) ×2 IMPLANT
TROCAR BALLN GELPORT 12X130M (ENDOMECHANICALS) ×2 IMPLANT

## 2021-11-05 NOTE — Interval H&P Note (Signed)
History and Physical Interval Note:  11/05/2021 9:28 AM  Jessica Holden  has presented today for surgery, with the diagnosis of gallstone pancreatitis.  The various methods of treatment have been discussed with the patient and family. After consideration of risks, benefits and other options for treatment, the patient has consented to  Procedure(s): XI ROBOTIC ASSISTED LAPAROSCOPIC CHOLECYSTECTOMY (N/A) Wallace (ICG) (N/A) as a surgical intervention.  The patient's history has been reviewed, patient examined, no change in status, stable for surgery.  I have reviewed the patient's chart and labs.  Questions were answered to the patient's satisfaction.     Danaiya Steadman

## 2021-11-05 NOTE — Anesthesia Postprocedure Evaluation (Signed)
Anesthesia Post Note  Patient: Makana Staton  Procedure(s) Performed: XI ROBOTIC ASSISTED LAPAROSCOPIC CHOLECYSTECTOMY (Abdomen) INDOCYANINE GREEN FLUORESCENCE IMAGING (ICG) (Abdomen) OPEN HERNIA REPAIR UMBILICAL ADULT (Abdomen)  Patient location during evaluation: PACU Anesthesia Type: General Level of consciousness: awake and alert Pain management: pain level controlled Vital Signs Assessment: post-procedure vital signs reviewed and stable Respiratory status: spontaneous breathing, nonlabored ventilation and respiratory function stable Cardiovascular status: blood pressure returned to baseline and stable Postop Assessment: no apparent nausea or vomiting Anesthetic complications: no   No notable events documented.   Last Vitals:  Vitals:   11/05/21 1443 11/05/21 1540  BP: (!) 144/82 117/71  Pulse: 83 90  Resp: 16 16  Temp: (!) 36.1 C   SpO2: 96% 97%    Last Pain:  Vitals:   11/05/21 1540  TempSrc:   PainSc: 3                  Foye Deer

## 2021-11-05 NOTE — Discharge Instructions (Addendum)
CIRUGIA AMBULATORIA       Instruccionnes de alta    Date Franco Nones) 11/05/2021    1.  Las drogas que se Dispensing optician en su cuerpo [employer], asi            que por las proximas 24 horas usted no debe:   Conducir Field seismologist) un automovil   Hacer ninguna decision legal   Tomar ninguna bebida alcoholica  2.  A) Manana puede comenzar una dieta regular.  Es mejor que hoy empiece con           liquidos y gradualmente anada [address]       B) Puede comer cualquier comida que desee pero es mejor empezar con liquidos,                      luego sopitas con galletas saladas y gradualmente llegar a las comidas solidas.  3.  Por favor avise a su medico inmediatamente si usted tiene algun sangrado anormal,       tiene dificultad con la respiracion, enrojecimiento y Engineer, mining en el sitio de la cirugia, [place],       fiebro o dolor que se alivia con [place].  4.  A) Su visita posoperatoria (despues de su operacion) es con el         B)  Por favor llame para hacer la cita posoperatoria.  5.  Istrucciones especificas :    Call MD for:  difficulty breathing, headache or visual disturbances  Call MD for:  persistant nausea and vomiting  Call MD for:  redness, tenderness, or signs of infection (pain, swelling, redness, odor or green/yellow discharge around incision site)  Call MD for:  severe uncontrolled pain  Call MD for:  temperature >100.4  Diet - low sodium heart healthy  Discharge instructions  1.  Patient may shower, but do not scrub wounds heavily and dab dry only.  2.  Do not submerge wounds in pool/tub until fully healed.  3.  Do not apply ointments or hydrogen peroxide to the wounds.  4.  May apply ice packs to the wounds for comfort. Driving Restrictions  Do not drive while taking narcotics for pain control.  Prior to driving, make sure you are able to rotate right and left to look at blindspots without significant pain or discomfort. Increase activity  slowly  Lifting restrictions  No heavy lifting or pushing of more than 10-15 lbs for 4 weeks. No dressing needed   Llame al mdico por: dificultad para respirar, dolor de cabeza o alteraciones visuales Llame al mdico por: nuseas y vmitos persistentes Llame al mdico si: enrojecimiento, sensibilidad o signos de infeccin (dolor, hinchazn, enrojecimiento, olor o secrecin verde/amarilla alrededor del sitio de la incisin) Llame al mdico por: dolor intenso no controlado Llame a MD para: temperatura >100.4 Dieta - saludable para el corazn baja en sodio Instrucciones de descarga 1. El paciente puede [place], [place] no frotar [employer] heridas y Public house manager con toquecitos. 2. No sumerja las heridas en la piscina o la baera hasta que estn completamente curadas. 3. No aplique pomadas ni perxido de hidrgeno en las heridas. 4. Puede aplicar bolsas de hielo a las heridas para mayor comodidad. Restricciones de conduccin No conduzca mientras est tomando narcticos para Human resources officer. Antes de Science writer, asegrese de poder girar hacia la derecha y hacia la izquierda para ver los puntos ciegos sin dolor ni molestias significativas. Aumente la actividad lentamente Restricciones de levantamiento No levantar objetos  pesados ni empujar ms de 10 a 15 libras durante 4 semanas. No se necesita vendaje

## 2021-11-05 NOTE — Anesthesia Procedure Notes (Signed)
Procedure Name: Intubation Date/Time: 11/05/2021 3:01 PM Performed by: Nelda Marseille, CRNA Pre-anesthesia Checklist: Patient identified, Patient being monitored, Timeout performed, Emergency Drugs available and Suction available Patient Re-evaluated:Patient Re-evaluated prior to induction Oxygen Delivery Method: Circle system utilized Preoxygenation: Pre-oxygenation with 100% oxygen Induction Type: IV induction Ventilation: Mask ventilation without difficulty Laryngoscope Size: Mac, 3 and McGraph Grade View: Grade I Tube type: Oral Tube size: 7.0 mm Number of attempts: 1 Airway Equipment and Method: Stylet Placement Confirmation: ETT inserted through vocal cords under direct vision, positive ETCO2 and breath sounds checked- equal and bilateral Secured at: 21 cm Tube secured with: Tape Dental Injury: Teeth and Oropharynx as per pre-operative assessment

## 2021-11-05 NOTE — Anesthesia Preprocedure Evaluation (Signed)
Anesthesia Evaluation  Patient identified by MRN, date of birth, ID band Patient awake    Reviewed: Allergy & Precautions, NPO status , Patient's Chart, lab work & pertinent test results  Airway Mallampati: II  TM Distance: >3 FB Neck ROM: Full    Dental no notable dental hx.    Pulmonary neg pulmonary ROS,    Pulmonary exam normal        Cardiovascular negative cardio ROS Normal cardiovascular exam     Neuro/Psych negative neurological ROS  negative psych ROS   GI/Hepatic negative GI ROS, Neg liver ROS,   Endo/Other  negative endocrine ROS  Renal/GU negative Renal ROS  negative genitourinary   Musculoskeletal negative musculoskeletal ROS (+)   Abdominal   Peds negative pediatric ROS (+)  Hematology negative hematology ROS (+)   Anesthesia Other Findings Gallstone pancreatitis    Hyperlipidemia    Pre-diabetes       Reproductive/Obstetrics negative OB ROS                             Anesthesia Physical Anesthesia Plan  ASA: 2  Anesthesia Plan: General   Post-op Pain Management:    Induction: Intravenous  PONV Risk Score and Plan: 2 and Ondansetron, Propofol infusion and Midazolam  Airway Management Planned: Oral ETT  Additional Equipment:   Intra-op Plan:   Post-operative Plan: Extubation in OR  Informed Consent: I have reviewed the patients History and Physical, chart, labs and discussed the procedure including the risks, benefits and alternatives for the proposed anesthesia with the patient or authorized representative who has indicated his/her understanding and acceptance.       Plan Discussed with: CRNA, Anesthesiologist and Surgeon  Anesthesia Plan Comments:         Anesthesia Quick Evaluation

## 2021-11-05 NOTE — Op Note (Signed)
°  Procedure Date:  11/05/2021  Pre-operative Diagnosis:  Gallstone pancreatitis  Post-operative Diagnosis: Gallstone pancreatitis and umbilical hernia  Procedure:  Robotic assisted cholecystectomy with ICG FireFly cholangiogram; open umbilical hernia repair.  Surgeon:  Howie Ill, MD  Anesthesia:  General endotracheal  Estimated Blood Loss:  10 ml  Specimens:  gallbladder  Complications:  Gallbladder  Indications for Procedure:  This is a 61 y.o. female who presents with abdominal pain and workup revealing gallstone pancreatitis.  The benefits, complications, treatment options, and expected outcomes were discussed with the patient. The risks of bleeding, infection, recurrence of symptoms, failure to resolve symptoms, bile duct damage, bile duct leak, retained common bile duct stone, bowel injury, and need for further procedures were all discussed with the patient and she was willing to proceed.  Description of Procedure: The patient was correctly identified in the preoperative area and brought into the operating room.  The patient was placed supine with VTE prophylaxis in place.  Appropriate time-outs were performed.  Anesthesia was induced and the patient was intubated.  Appropriate antibiotics were infused.  The abdomen was prepped and draped in a sterile fashion. The patient was noted to have a small umbilical hernia.  An infraumbilical incision was made. Cautery was used to dissect down the umbilical stalk and to separate it from the fascia, revealing a 1 cm defect.  The fascial edges were cleared and a 12 mm robotic port was inserted.  Pneumoperitoneum was obtained with appropriate opening pressures.  Three 8-mm ports were placed in the mid abdomen at the level of the umbilicus under direct visualization.  The DaVinci platform was docked, camera targeted, and instruments were placed under direct visualization.  The gallbladder was identified.  The fundus was grasped and retracted  cephalad.  Adhesions were lysed bluntly and with electrocautery. The infundibulum was grasped and retracted laterally, exposing the peritoneum overlying the gallbladder.  This was incised with electrocautery and extended on either side of the gallbladder.  FireFly cholangiogram was then obtained, and we were able to clearly identify the cystic duct and common bile duct.  The cystic duct and cystic artery were carefully dissected with combination of cautery and blunt dissection.  Both were clipped twice proximally and once distally, cutting in between.  The gallbladder was taken from the gallbladder fossa in a retrograde fashion with electrocautery. The gallbladder was placed in an Endocatch bag. The liver bed was inspected and any bleeding was controlled with electrocautery. The right upper quadrant was then inspected again revealing intact clips, no bleeding, and no ductal injury.   The 8 mm ports were removed under direct visualization and the 12 mm port was removed.  The Endocatch bag was brought out via the umbilical incision. The hernia defect was closed using 0 vicryl suture.  Local anesthetic was infused in all incisions.  The umbilical stalk was reattached using 2-0 Vicryl.  The umbilical wound was closed using 3-0 Vicryl and 4-0 Monocryl, and the remaining incisions were closed with 4-0 Monocryl.  The wounds were cleaned and sealed with DermaBond.  The patient was emerged from anesthesia and extubated and brought to the recovery room for further management.  The patient tolerated the procedure well and all counts were correct at the end of the case.   Howie Ill, MD

## 2021-11-05 NOTE — Transfer of Care (Signed)
Immediate Anesthesia Transfer of Care Note  Patient: Jessica Holden  Procedure(s) Performed: XI ROBOTIC ASSISTED LAPAROSCOPIC CHOLECYSTECTOMY (Abdomen) INDOCYANINE GREEN FLUORESCENCE IMAGING (ICG) (Abdomen) OPEN HERNIA REPAIR UMBILICAL ADULT (Abdomen)  Patient Location: PACU  Anesthesia Type:General  Level of Consciousness: sedated  Airway & Oxygen Therapy: Patient Spontanous Breathing and Patient connected to face mask oxygen  Post-op Assessment: Report given to RN and Post -op Vital signs reviewed and stable  Post vital signs: Reviewed and stable  Last Vitals:  Vitals Value Taken Time  BP    Temp    Pulse 72 11/05/21 1300  Resp 14 11/05/21 1300  SpO2 100 % 11/05/21 1300  Vitals shown include unvalidated device data.  Last Pain:  Vitals:   11/05/21 0931  TempSrc: Oral  PainSc: 0-No pain         Complications: No notable events documented.

## 2021-11-06 ENCOUNTER — Telehealth: Payer: Self-pay | Admitting: *Deleted

## 2021-11-06 ENCOUNTER — Encounter: Payer: Self-pay | Admitting: Surgery

## 2021-11-06 ENCOUNTER — Telehealth: Payer: Self-pay

## 2021-11-06 NOTE — Telephone Encounter (Signed)
Patient husband will be coming by to pick up FMLA paperwork tomorrow morning so his wife can sign them.

## 2021-11-06 NOTE — Telephone Encounter (Signed)
FMLA completed and is put at the front desk for patient to pick up

## 2021-11-07 LAB — SURGICAL PATHOLOGY

## 2021-11-19 ENCOUNTER — Encounter: Payer: Self-pay | Admitting: Physician Assistant

## 2021-11-19 ENCOUNTER — Ambulatory Visit (INDEPENDENT_AMBULATORY_CARE_PROVIDER_SITE_OTHER): Payer: BLUE CROSS/BLUE SHIELD | Admitting: Surgery

## 2021-11-19 ENCOUNTER — Encounter: Payer: BLUE CROSS/BLUE SHIELD | Admitting: Surgery

## 2021-11-19 ENCOUNTER — Encounter: Payer: Self-pay | Admitting: Surgery

## 2021-11-19 ENCOUNTER — Other Ambulatory Visit: Payer: Self-pay

## 2021-11-19 ENCOUNTER — Ambulatory Visit (INDEPENDENT_AMBULATORY_CARE_PROVIDER_SITE_OTHER): Payer: BLUE CROSS/BLUE SHIELD | Admitting: Physician Assistant

## 2021-11-19 VITALS — BP 122/83 | HR 70 | Temp 98.3°F | Ht 61.0 in | Wt 161.0 lb

## 2021-11-19 VITALS — BP 133/83 | HR 74 | Temp 98.3°F | Ht 60.0 in | Wt 162.6 lb

## 2021-11-19 DIAGNOSIS — K851 Biliary acute pancreatitis without necrosis or infection: Secondary | ICD-10-CM

## 2021-11-19 DIAGNOSIS — Z09 Encounter for follow-up examination after completed treatment for conditions other than malignant neoplasm: Secondary | ICD-10-CM

## 2021-11-19 NOTE — Progress Notes (Signed)
11/19/2021  HPI: Jessica Holden is a 61 y.o. female s/p robotic assisted cholecystectomy on 11/05/2021.  Patient presents today for follow-up.  Patient reports that she has been having some discomfort at the umbilicus with some mild drainage that she is noted on her clothes.  Denies any fevers, chills, chest pain, shortness of breath.  Vital signs: BP 133/83    Pulse 74    Temp 98.3 F (36.8 C) (Oral)    Ht 5' (1.524 m)    Wt 162 lb 9.6 oz (73.8 kg)    SpO2 91%    BMI 31.76 kg/m    Physical Exam: Constitutional: No acute distress Abdomen: Soft, nondistended, with mild tenderness to palpation at the umbilicus.  The incision itself has superficial dehiscence of both corners of the incision with no visible subcutaneous tissue.  The wound was mildly debrided at bedside sharply with scissors and then dressed with a dry gauze and tape dressing.  The other incisions are healing well and are clean, dry, intact.  Assessment/Plan: This is a 61 y.o. female s/p robotic assisted cholecystectomy.  - Discussed with the patient that the umbilical incision has a very superficial dehiscence with scab is formed over the wound area.  This was sharply debrided today to let it heal better.  Dry gauze dressing was applied.  No antibiotics are needed at this point.  Instructed her on how to do this daily dressing change. - Follow-up in 2 weeks for wound check.  Howie Ill, MD [facility]

## 2021-11-19 NOTE — Progress Notes (Deleted)
[facility] POST-OP OFFICE VISIT  11/19/2021  HPI: Jessica Holden is a 61 y.o. female 14 days s/p robotic assisted laparoscopic cholecystectomy for gallstone pancreatitis with Dr Aleen Campi.   ***  Vital signs: There were no vitals taken for this visit.   Physical Exam: Constitutional: Well appearing female, NAD Abdomen: ***Soft, ***non-tender, non-distended, no rebound/guarding Skin: ***Laparoscopic incisions are healing well, ***no erythema or drainage   Assessment/Plan: This is a 61 y.o. female 14 days s/p robotic assisted laparoscopic cholecystectomy for gallstone pancreatitis   - ***Pain control prn  - ***Reviewed wound care recommendation  - ***Reviewed lifting restrictions; 4 weeks total  - Reviewed surgical pathology; Chronic cholecystitis, negative for malignancy  - ***She can follow up on as needed basis; She understands to call with questions/concerns  -- Lynden Oxford, PA-C [facility] 11/19/2021, 10:05 AM [phone] M-F: 7am - 4pm

## 2021-11-19 NOTE — Patient Instructions (Addendum)
If you have any concerns or questions, please feel free to call our office. See follow up appointment below.   GENERAL POST-OPERATIVE PATIENT INSTRUCTIONS   WOUND CARE INSTRUCTIONS:  Keep a dry clean dressing on the wound if there is drainage. The initial bandage may be removed after 24 hours.  Once the wound has quit draining you may leave it open to air.  If clothing rubs against the wound or causes irritation and the wound is not draining you may cover it with a dry dressing during the daytime.  Try to keep the wound dry and avoid ointments on the wound unless directed to do so.  If the wound becomes bright red and painful or starts to drain infected material that is not clear, please contact your physician immediately.  If the wound is mildly pink and has a thick firm ridge underneath it, this is normal, and is referred to as a healing ridge.  This will resolve over the next 4-6 weeks.  BATHING: You may shower if you have been informed of this by your surgeon. However, Please do not submerge in a tub, hot tub, or pool until incisions are completely sealed or have been told by your surgeon that you may do so.  DIET:  You may eat any foods that you can tolerate.  It is a good idea to eat a high fiber diet and take in plenty of fluids to prevent constipation.  If you do become constipated you may want to take a mild laxative or take ducolax tablets on a daily basis until your bowel habits are regular.  Constipation can be very uncomfortable, along with straining, after recent surgery.  ACTIVITY:  You are encouraged to cough and deep breath or use your incentive spirometer if you were given one, every 15-30 minutes when awake.  This will help prevent respiratory complications and low grade fevers post-operatively if you had a general anesthetic.  You may want to hug a pillow when coughing and sneezing to add additional support to the surgical area, if you had abdominal or chest surgery, which will  decrease pain during these times.  You are encouraged to walk and engage in light activity for the next two weeks.  You should not lift, push or pull more than 15-20 pounds, until 12/03/2021 as it could put you at increased risk for complications.  Twenty pounds is roughly equivalent to a plastic bag of groceries. At that time- Listen to your body when lifting, if you have pain when lifting, stop and then try again in a few days. Soreness after doing exercises or activities of daily living is normal as you get back in to your normal routine.  MEDICATIONS:  Try to take narcotic medications and anti-inflammatory medications, such as tylenol, ibuprofen, naprosyn, etc., with food.  This will minimize stomach upset from the medication.  Should you develop nausea and vomiting from the pain medication, or develop a rash, please discontinue the medication and contact your physician.  You should not drive, make important decisions, or operate machinery when taking narcotic pain medication.  SUNBLOCK Use sun block to incision area over the next year if this area will be exposed to sun. This helps decrease scarring and will allow you avoid a permanent darkened area over your incision.  QUESTIONS:  Please feel free to call our office if you have any questions, and we will be glad to assist you.  Colecistectoma mnimamente invasiva, cuidados posteriores Minimally Invasive Cholecystectomy, Care After Ladell Heads  puedo esperar despus del procedimiento? Despus del procedimiento, es comn [employer] siguientes sntomas: Financial risk analyst en las zonas de la [place]. Le darn medicamentos para Chief Technology Officer. Vomitar o tener nuseas. Sentir el vientre lleno (meteorismo) o Surveyor, mining en el hombro. Esto se debe al gas que se [place] durante la [place]. Siga estas instrucciones en su casa: Medicamentos Use los medicamentos de venta libre y los recetados solamente como se lo haya indicado el mdico. Si le recetaron un antibitico, tmelo  como se lo haya indicado el mdico. No deje de tomarlo aunque comience a sentirse mejor. Si se lo indican, tome medidas a fin de prevenir problemas para ir de cuerpo (estreimiento). Es posible que deba hacer lo siguiente: Product manager suficiente lquido para Radio producer pis (la orina) de color amarillo plido. Tomar medicamentos. Le dirn qu medicamentos debe tomar. Comer alimentos ricos en fibra. Entre ellos, frijoles, cereales integrales y frutas y verduras frescas. Limitar los alimentos con alto contenido de grasa y International aid/development worker. Estos incluyen alimentos fritos o dulces. Pregunte al mdico si debe evitar conducir o Chemical engineer mquinas mientras toma los medicamentos. Cuidado de la incisin  Siga las instrucciones del mdico en lo que respecta al cuidado de los cortes de la ciruga (incisiones). Asegrese de hacer lo siguiente: Lvese las manos con agua y jabn durante al menos 20 segundos antes y despus de cambiarse la venda (vendaje). Use un desinfectante para manos si no dispone de [place] y [place]. Cmbiese la venda. Deje los puntos (suturas) o la goma para cerrar la piel en su lugar durante al menos 2 semanas. Deje colocadas las tiras de [place] a menos que se le indique que se las quite. Puede recortar los bordes de las tiras de cinta si se enrollan. No tome baos de inmersin, no practique natacin ni utilice el jacuzzi. Pregunte a su mdico si puede ducharse o darse baos de esponja. Controle la zona de la incisin todos los [address]  [address] para detectar signos de infeccin. Est atento a los siguientes signos: Aumento del enrojecimiento, la hinchazn o Chief Technology Officer. Lquido o sangre. Calor. Pus o mal olor. Actividad Haga reposo como se lo haya indicado el mdico. No realice actividades que requieran mucho esfuerzo. Levntese y camine un poco cada 1 a 2 horas. Pida ayuda si se siente dbil o inestable. No levante objetos que pesen ms de 10 libras (4.5 kg) o que sean ms pesados de lo que le indicaron. No  practique deportes de contacto hasta que el mdico lo autorice. No retome el trabajo ni el estudio hasta que el mdico lo autorice. Retome sus actividades normales cuando el mdico le diga que es seguro. Instrucciones generales Si le administraron un sedante durante el procedimiento, no conduzca ni use mquinas hasta que el mdico le indique que es seguro [place]. Un sedante es un medicamento que ayuda a [place]. Concurra a todas las visitas de seguimiento. Comunquese con un mdico si: Aparece una erupcin cutnea. Aumentan el enrojecimiento, la hinchazn o el dolor alrededor de las incisiones. Le sale lquido o sangre de las incisiones. Las incisiones estn calientes al tacto. Tiene pus o percibe que sale mal olor del lugar de las incisiones. Tiene fiebre. Una o ms de las incisiones se abren. Solicite ayuda de inmediato si: Tiene dificultad para respirar. Siente dolor en el pecho. Siente dolor que empeora en la zona de los hombros. Se desmaya o se siente mareado al ponerse de pie. Tiene dolor muy intenso en el vientre (abdomen). Siente que va a vomitar  o vomita y esto dura ms de Civil engineer, contracting. Siente dolor en la pierna. Estos sntomas pueden Customer service manager. Solicite ayuda de inmediato. Llame al 911. No espere a ver si los sntomas desaparecen. No conduzca por sus propios medios [employer]. Resumen Despus de la [place], es comn sentir dolor en las zonas de la [place]. Tambin puede tener vmitos o sentir llenura en el vientre. Siga las instrucciones del mdico acerca de los medicamentos, la restriccin de actividades y el cuidado de las zonas de la [place]. No realice actividades que requieran mucho esfuerzo. Comunquese con el mdico si tiene fiebre u otros signos de infeccin, como ms enrojecimiento, hinchazn o dolor alrededor [employer]. Busque ayuda de inmediato si tiene dolor de pecho, Engineer, mining en los hombros que va en aumento o problemas para Industrial/product designer. Esta  informacin no tiene Theme park manager el consejo del mdico. Asegrese de hacerle al mdico cualquier pregunta que tenga. Document Revised: 04/09/2021 Document Reviewed: 04/09/2021 Elsevier Patient Education  2022 [employer].

## 2021-12-03 ENCOUNTER — Encounter: Payer: Self-pay | Admitting: Surgery

## 2021-12-03 ENCOUNTER — Ambulatory Visit (INDEPENDENT_AMBULATORY_CARE_PROVIDER_SITE_OTHER): Payer: BLUE CROSS/BLUE SHIELD | Admitting: Surgery

## 2021-12-03 ENCOUNTER — Other Ambulatory Visit: Payer: Self-pay

## 2021-12-03 VITALS — BP 133/82 | HR 81 | Temp 98.4°F | Ht 60.0 in | Wt 161.8 lb

## 2021-12-03 DIAGNOSIS — K429 Umbilical hernia without obstruction or gangrene: Secondary | ICD-10-CM

## 2021-12-03 DIAGNOSIS — K851 Biliary acute pancreatitis without necrosis or infection: Secondary | ICD-10-CM

## 2021-12-03 DIAGNOSIS — Z09 Encounter for follow-up examination after completed treatment for conditions other than malignant neoplasm: Secondary | ICD-10-CM

## 2021-12-03 DIAGNOSIS — T8131XD Disruption of external operation (surgical) wound, not elsewhere classified, subsequent encounter: Secondary | ICD-10-CM

## 2021-12-03 NOTE — Progress Notes (Signed)
12/03/2021 ? ?HPI: ?Jessica Holden is a 61 y.o. female s/p robotic assisted cholecystectomy with open umbilical hernia repair on 11/05/2021.  Patient was last seen on 11/19/2021 at which time the umbilical wound had a superficial dehiscence with some scabbing fibrinous material that was sharply debrided at bedside.  The patient presents today for follow-up.  Patient reports that at the umbilical incision, there is an area of white tissue that is larger compared to before.  Denies any drainage but reports some discomfort in that area.  She has tried apply dry gauze dressing but reports that the tape was causing her a big rash and so she is just using her underwear to hold the gauze in place. ? ?Vital signs: ?BP 133/82   Pulse 81   Temp 98.4 ?F (36.9 ?C) (Oral)   Ht 5' (1.524 m)   Wt 161 lb 12.8 oz (73.4 kg)   SpO2 96%   BMI 31.60 kg/m?   ? ?Physical Exam: ?Constitutional: No acute distress ?Abdomen: Soft, nondistended, nontender to palpation.  Umbilical wound has again central area of fibrinous material at the superior edge of the wound.  This measured about 1 x 1 cm.  This was sharply debrided using a #15 scalpel.  Silver nitrate was then applied at the wound bed to help with the healing process and 1 small area of oozing.  Dry gauze dressing was packed into the umbilicus with dry gauze on the outside to cover. ? ?Assessment/Plan: ?This is a 61 y.o. female s/p robotic assisted cholecystectomy with open umbilical hernia repair. ? ?- The patient is doing well from the gallbladder standpoint but her umbilical incision has another area of fibrinous material was debrided today sharply.  Silver nitrate was applied to help with the healing process and instructed her to do dry gauze dressing changes daily.  She may shower as usual. ?- At this point reassured her that the wound does not appear to be infected and no antibiotics are needed at this point. ?- Follow-up in about 10 to 14 days. ? ? ?Howie Ill,  MD ?[facility]  ?

## 2021-12-03 NOTE — Patient Instructions (Addendum)
? ?  Please keep a dry dressing over the area and change it daily.  ? ?GENERAL POST-OPERATIVE ?PATIENT INSTRUCTIONS  ? ?WOUND CARE INSTRUCTIONS:  Keep a dry clean dressing on the wound if there is drainage. The initial bandage may be removed after 24 hours.  Once the wound has quit draining you may leave it open to air.  If clothing rubs against the wound or causes irritation and the wound is not draining you may cover it with a dry dressing during the daytime.  Try to keep the wound dry and avoid ointments on the wound unless directed to do so.  If the wound becomes bright red and painful or starts to drain infected material that is not clear, please contact your physician immediately.  If the wound is mildly pink and has a thick firm ridge underneath it, this is normal, and is referred to as a healing ridge.  This will resolve over the next 4-6 weeks. ? ?BATHING: ?You may shower if you have been informed of this by your surgeon. However, Please do not submerge in a tub, hot tub, or pool until incisions are completely sealed or have been told by your surgeon that you may do so. ? ?DIET:  You may eat any foods that you can tolerate.  It is a good idea to eat a high fiber diet and take in plenty of fluids to prevent constipation.  If you do become constipated you may want to take a mild laxative or take ducolax tablets on a daily basis until your bowel habits are regular.  Constipation can be very uncomfortable, along with straining, after recent surgery. ? ?ACTIVITY:  You are encouraged to cough and deep breath or use your incentive spirometer if you were given one, every 15-30 minutes when awake.  This will help prevent respiratory complications and low grade fevers post-operatively if you had a general anesthetic.  You may want to hug a pillow when coughing and sneezing to add additional support to the surgical area, if you had abdominal or chest surgery, which will decrease pain during these times.  You are  encouraged to walk and engage in light activity for the next two weeks.  You should not lift more than 20 pounds, until 12/17/2021 as it could put you at increased risk for complications.  Twenty pounds is roughly equivalent to a plastic bag of groceries. At that time- Listen to your body when lifting, if you have pain when lifting, stop and then try again in a few days. Soreness after doing exercises or activities of daily living is normal as you get back in to your normal routine. ? ?MEDICATIONS:  Try to take narcotic medications and anti-inflammatory medications, such as tylenol, ibuprofen, naprosyn, etc., with food.  This will minimize stomach upset from the medication.  Should you develop nausea and vomiting from the pain medication, or develop a rash, please discontinue the medication and contact your physician.  You should not drive, make important decisions, or operate machinery when taking narcotic pain medication. ? ?SUNBLOCK ?Use sun block to incision area over the next year if this area will be exposed to sun. This helps decrease scarring and will allow you avoid a permanent darkened area over your incision. ? ?QUESTIONS:  Please feel free to call our office if you have any questions, and we will be glad to assist you. [phone] ? ? ?

## 2021-12-15 ENCOUNTER — Ambulatory Visit (INDEPENDENT_AMBULATORY_CARE_PROVIDER_SITE_OTHER): Payer: BLUE CROSS/BLUE SHIELD | Admitting: Surgery

## 2021-12-15 ENCOUNTER — Other Ambulatory Visit: Payer: Self-pay

## 2021-12-15 ENCOUNTER — Encounter: Payer: BLUE CROSS/BLUE SHIELD | Admitting: Surgery

## 2021-12-15 ENCOUNTER — Encounter: Payer: Self-pay | Admitting: Surgery

## 2021-12-15 VITALS — BP 143/87 | HR 76 | Temp 98.3°F | Ht 60.0 in | Wt 161.6 lb

## 2021-12-15 DIAGNOSIS — T8131XD Disruption of external operation (surgical) wound, not elsewhere classified, subsequent encounter: Secondary | ICD-10-CM

## 2021-12-15 DIAGNOSIS — K429 Umbilical hernia without obstruction or gangrene: Secondary | ICD-10-CM

## 2021-12-15 DIAGNOSIS — K851 Biliary acute pancreatitis without necrosis or infection: Secondary | ICD-10-CM

## 2021-12-15 DIAGNOSIS — Z09 Encounter for follow-up examination after completed treatment for conditions other than malignant neoplasm: Secondary | ICD-10-CM

## 2021-12-15 MED ORDER — COLLAGENASE 250 UNIT/GM EX OINT: 1.0000 "application " | TOPICAL_OINTMENT | Freq: Every day | CUTANEOUS | 0 refills | Status: AC

## 2021-12-15 NOTE — Patient Instructions (Addendum)
Apply ointment 2 times a day to the bellybutton wound with a Q-tip. Cover with a dry gauze dressing.  ?aplique ung?ento en la herida del ombligo 2 veces al d?a con un Q-tip. [place] con una gasa seca. ? ?Do this for 1 week. ?haz esto por 1 semana ?After you may just cover with a dry gauze. ?Despu?s de 1 semana simplemente [place] con una gasa seca. ? ?Follow up here in 3 weeks.  ?seguimiento aqu? en 3 semanas. ?

## 2021-12-15 NOTE — Progress Notes (Signed)
12/15/2021 ? ?HPI: ?Jessica Holden is a 61 y.o. female s/p robotic assisted cholecystectomy and open umbilical hernia repair on 11/05/2021.  Patient returns today for follow-up.  She reports that her wound continues to drain and has not healed well yet.  Denies any purulent drainage or worsening pain. ? ?Vital signs: ?BP (!) 143/87   Pulse 76   Temp 98.3 ?F (36.8 ?C)   Ht 5' (1.524 m)   Wt 161 lb 9.6 oz (73.3 kg)   SpO2 98%   BMI 31.56 kg/m?   ? ?Physical Exam: ?Constitutional: No acute distress ?Abdomen: Soft, nondistended, nontender to palpation.  Umbilical wound remains open at the skin edges with fibrinous material in between.  The wound edges are about 1 cm apart going from the anterior edge of the umbilicus down towards the base of the umbilicus.  The fibrinous material was sharply debrided today using scissors and the wound was dressed with dry gauze dressing and tape. ? ?Assessment/Plan: ?This is a 61 y.o. female s/p robotic assisted cholecystectomy and open umbilical hernia repair, with umbilical wound dehiscence. ? ?- Discussed with the patient that again she developed more fibrinous material over the wound which is impeding wound healing at this point.  Silver nitrate helped for short time but overall the fibrinous material has returned.  I have further debrided this down to healthier tissue again sharply using scissors and we will give her a prescription for Santyl ointment that she can continue applying to the wound to continue chemical debridement.  I have instructed to do this twice a day for 1 week and to then switch back to just dry gauze dressing changes. ?- She will follow-up with me in 3 weeks to reassess her wound. ? ? ?Howie Ill, MD ?[facility]  ?

## 2022-01-05 ENCOUNTER — Encounter: Payer: Self-pay | Admitting: Surgery

## 2022-01-05 ENCOUNTER — Ambulatory Visit (INDEPENDENT_AMBULATORY_CARE_PROVIDER_SITE_OTHER): Payer: Self-pay | Admitting: Surgery

## 2022-01-05 ENCOUNTER — Other Ambulatory Visit: Payer: Self-pay

## 2022-01-05 VITALS — BP 137/89 | HR 77 | Temp 98.3°F | Ht 60.0 in | Wt 158.0 lb

## 2022-01-05 DIAGNOSIS — T8131XD Disruption of external operation (surgical) wound, not elsewhere classified, subsequent encounter: Secondary | ICD-10-CM

## 2022-01-05 DIAGNOSIS — Z09 Encounter for follow-up examination after completed treatment for conditions other than malignant neoplasm: Secondary | ICD-10-CM

## 2022-01-05 DIAGNOSIS — K851 Biliary acute pancreatitis without necrosis or infection: Secondary | ICD-10-CM

## 2022-01-05 NOTE — Progress Notes (Signed)
01/05/2022 ? ?HPI: ?Jessica Holden is a 61 y.o. female s/p robotic assisted cholecystectomy and open umbilical hernia repair on 11/05/21.  She had an umbilical wound dehiscence that has taken a while to heal.  She needed debridement in office of fibrinous material and was given Santyl on her last appointment to use to help with the wound healing.  She reports today that she's doing well, but reports she had mild oozing of blood from the wound a few days ago.  She has been using the Santyl and reports sometimes discomfort at the umbilical area and feels that the wound dressing pulls on things at times. ? ?Vital signs: ?BP 137/89   Pulse 77   Temp 98.3 ?F (36.8 ?C) (Oral)   Ht 5' (1.524 m)   Wt 158 lb (71.7 kg)   SpO2 94%   BMI 30.86 kg/m?   ? ?Physical Exam: ?Constitutional:  No acute distress ?Abdomen:  soft, non-distended, non-tender.  Umbilical wound is much improved now with only minimal areas of open wound, with the central area now almost healed over.  Dressed with gauze and tape. ? ?Assessment/Plan: ?This is a 61 y.o. female with wound dehiscence following robotic assisted cholecystectomy with open umbilical hernia repair. ? ?--Discussed with the patient that the wound is healing a lot better now with the Santyl compared to before.  There is minimal fibrinous material and the central area of the wound is almost healed.  Recommended that she continue using the santyl for another week and then to only do dry gauze dressing changes, making sure to have gauze going inside the umbilical area to keep the area clean and dry. ?--Follow up in three weeks to assess her progress, but she may cancel the appointment if the wound is healed. ? ? ?Melvyn Neth, MD ?[facility]  ?

## 2022-01-05 NOTE — Patient Instructions (Signed)

## 2022-01-26 ENCOUNTER — Encounter: Payer: Self-pay | Admitting: Surgery

## 2022-04-08 ENCOUNTER — Ambulatory Visit
Admission: RE | Admit: 2022-04-08 | Discharge: 2022-04-08 | Disposition: A | Discharge status: Home / Self Care | Payer: BLUE CROSS/BLUE SHIELD | Source: Ambulatory Visit | Attending: Family Medicine | Admitting: Family Medicine

## 2022-04-08 DIAGNOSIS — Z1231 Encounter for screening mammogram for malignant neoplasm of breast: Secondary | ICD-10-CM | POA: Insufficient documentation

## 2022-04-16 ENCOUNTER — Other Ambulatory Visit: Payer: Self-pay | Admitting: Family Medicine

## 2022-04-16 DIAGNOSIS — R928 Other abnormal and inconclusive findings on diagnostic imaging of breast: Secondary | ICD-10-CM

## 2022-04-27 ENCOUNTER — Ambulatory Visit
Admission: RE | Admit: 2022-04-27 | Discharge: 2022-04-27 | Disposition: A | Discharge status: Home / Self Care | Payer: BLUE CROSS/BLUE SHIELD | Source: Ambulatory Visit | Attending: Family Medicine | Admitting: Family Medicine

## 2022-04-27 DIAGNOSIS — R928 Other abnormal and inconclusive findings on diagnostic imaging of breast: Secondary | ICD-10-CM | POA: Diagnosis present

## 2022-07-15 ENCOUNTER — Encounter: Payer: Self-pay | Admitting: Family Medicine

## 2022-07-16 ENCOUNTER — Other Ambulatory Visit: Payer: Self-pay

## 2022-07-16 ENCOUNTER — Telehealth: Payer: Self-pay

## 2022-07-16 DIAGNOSIS — Z1211 Encounter for screening for malignant neoplasm of colon: Secondary | ICD-10-CM

## 2022-07-16 MED ORDER — NA SULFATE-K SULFATE-MG SULF 17.5-3.13-1.6 GM/177ML PO SOLN: 1.0000 | Freq: Once | ORAL | 0 refills | Status: AC

## 2022-07-16 NOTE — Telephone Encounter (Signed)
Call made with the assistance of [facility] Interpreters ID [id number] [facility]  Gastroenterology Pre-Procedure Review  Request Date: 08/07/22 Requesting Physician: Dr. Marius Ditch  PATIENT REVIEW QUESTIONS: The patient responded to the following health history questions as indicated:    1. Are you having any GI issues? yes (thinks she may have rectal bleeding unsure because she has experienced vaginal bleeding) 2. Do you have a personal history of Polyps? no 3. Do you have a family history of Colon Cancer or Polyps? no 4. Diabetes Mellitus? no 5. Joint replacements in the past 12 months?appendix last year 6. Major health problems in the past 3 months?no 7. Any artificial heart valves, MVP, or defibrillator?no    MEDICATIONS & ALLERGIES:    Patient reports the following regarding taking any anticoagulation/antiplatelet therapy:   Plavix, Coumadin, Eliquis, Xarelto, Lovenox, Pradaxa, Brilinta, or Effient? no Aspirin? no  Patient confirms/reports the following medications:  Current Outpatient Medications  Medication Sig Dispense Refill   acetaminophen (TYLENOL) 500 MG tablet Take 1,000 mg by mouth every 6 (six) hours as needed for moderate pain.     cetirizine (ZYRTEC) 10 MG tablet Take 10 mg by mouth daily.     collagenase (SANTYL) 250 UNIT/GM ointment Apply 1 application. topically daily. Apply to bellybutton wound with a Q-tip 2 times a day. aplicar en la herida del ombligo 2 veces al da 15 g 0   ibuprofen (ADVIL) 600 MG tablet Take 1 tablet (600 mg total) by mouth every 8 (eight) hours as needed for moderate pain. 60 tablet 1   omeprazole (PRILOSEC) 20 MG capsule Take 20 mg by mouth daily.     No current facility-administered medications for this visit.    Patient confirms/reports the following allergies:  No Known Allergies  No orders of the defined types were placed in this encounter.   AUTHORIZATION INFORMATION Primary Insurance: 1D#: Group #:  Secondary Insurance: 1D#: Group  #:  SCHEDULE INFORMATION: Date: 08/07/22 Time: Location: [facility]

## 2022-08-07 ENCOUNTER — Ambulatory Visit: Payer: 59 | Admitting: Anesthesiology

## 2022-08-07 ENCOUNTER — Encounter: Payer: Self-pay | Admitting: Gastroenterology

## 2022-08-07 ENCOUNTER — Other Ambulatory Visit: Payer: Self-pay

## 2022-08-07 ENCOUNTER — Encounter
Admission: RE | Disposition: A | Discharge status: Home / Self Care | Payer: Self-pay | Source: Home / Self Care | Attending: Gastroenterology

## 2022-08-07 ENCOUNTER — Ambulatory Visit
Admission: RE | Admit: 2022-08-07 | Discharge: 2022-08-07 | Disposition: A | Discharge status: Home / Self Care | Payer: 59 | Attending: Gastroenterology | Admitting: Gastroenterology

## 2022-08-07 DIAGNOSIS — K219 Gastro-esophageal reflux disease without esophagitis: Secondary | ICD-10-CM | POA: Insufficient documentation

## 2022-08-07 DIAGNOSIS — Z1211 Encounter for screening for malignant neoplasm of colon: Secondary | ICD-10-CM | POA: Insufficient documentation

## 2022-08-07 HISTORY — DX: Gastro-esophageal reflux disease without esophagitis: K21.9

## 2022-08-07 HISTORY — PX: COLONOSCOPY WITH PROPOFOL: SHX5780

## 2022-08-07 SURGERY — COLONOSCOPY WITH PROPOFOL
Anesthesia: General

## 2022-08-07 MED ORDER — LIDOCAINE HCL (CARDIAC) PF 100 MG/5ML IV SOSY
PREFILLED_SYRINGE | INTRAVENOUS | Status: DC | PRN
  Administered 2022-08-07: 50 mg via INTRAVENOUS

## 2022-08-07 MED ORDER — MIDAZOLAM HCL 2 MG/2ML IJ SOLN
INTRAMUSCULAR | Status: DC | PRN
  Administered 2022-08-07: 2 mg via INTRAVENOUS

## 2022-08-07 MED ORDER — MIDAZOLAM HCL 2 MG/2ML IJ SOLN
INTRAMUSCULAR | Status: AC
  Filled 2022-08-07: qty 2

## 2022-08-07 MED ORDER — SODIUM CHLORIDE 0.9 % IV SOLN: INTRAVENOUS | Status: DC

## 2022-08-07 MED ORDER — PROPOFOL 500 MG/50ML IV EMUL
INTRAVENOUS | Status: DC | PRN
  Administered 2022-08-07: 150 ug/kg/min via INTRAVENOUS

## 2022-08-07 MED ORDER — SODIUM CHLORIDE 0.9 % IV SOLN: INTRAVENOUS | Status: DC | PRN

## 2022-08-07 MED ORDER — KETAMINE HCL 50 MG/5ML IJ SOSY
PREFILLED_SYRINGE | INTRAMUSCULAR | Status: AC
  Filled 2022-08-07: qty 5

## 2022-08-07 MED ORDER — PROPOFOL 10 MG/ML IV BOLUS
INTRAVENOUS | Status: DC | PRN
  Administered 2022-08-07: 50 mg via INTRAVENOUS

## 2022-08-07 MED ORDER — PROPOFOL 1000 MG/100ML IV EMUL
INTRAVENOUS | Status: AC
  Filled 2022-08-07: qty 100

## 2022-08-07 MED ORDER — KETAMINE HCL 10 MG/ML IJ SOLN
INTRAMUSCULAR | Status: DC | PRN
  Administered 2022-08-07: 20 mg via INTRAVENOUS

## 2022-08-07 NOTE — H&P (Addendum)
Arlyss Repress, MD [address]  [address]  [place], [place] [address]  Main: [phone]  Fax: [phone] Pager: [phone]  Primary Care Physician:  [facility] Primary Gastroenterologist:  Dr. Arlyss Repress  Pre-Procedure History & Physical: HPI:  Jessica Holden is a 61 y.o. female is here for a colonoscopy.   Past Medical History:  Diagnosis Date   Gallstone pancreatitis    GERD (gastroesophageal reflux disease)    Hyperlipidemia    Pre-diabetes     Past Surgical History:  Procedure Laterality Date   ARTHROSCOPIC ROTATOR CUFF REPAIR Right 10/19/2011   ROBOTIC ASSISTED LAPAROSCOPIC CHOLECYSTECTOMY N/A 11/05/2021   UMBILICAL HERNIA REPAIR N/A 11/05/2021   Procedure: OPEN HERNIA REPAIR UMBILICAL ADULT;  Surgeon: Henrene Dodge, MD;  Location: [facility] ORS;  Service: General;  Laterality: N/A;    Prior to Admission medications   Medication Sig Start Date End Date Taking? Authorizing Provider  acetaminophen (TYLENOL) 500 MG tablet Take 1,000 mg by mouth every 6 (six) hours as needed for moderate pain.   Yes [provider]  cetirizine (ZYRTEC) 10 MG tablet Take 10 mg by mouth daily.   Yes [provider]  collagenase (SANTYL) 250 UNIT/GM ointment Apply 1 application. topically daily. Apply to bellybutton wound with a Q-tip 2 times a day. aplicar en la herida del ombligo 2 veces al da 12/15/21  Yes Piscoya, Jose, MD  ibuprofen (ADVIL) 600 MG tablet Take 1 tablet (600 mg total) by mouth every 8 (eight) hours as needed for moderate pain. 11/05/21  Yes Piscoya, Elita Quick, MD  omeprazole (PRILOSEC) 20 MG capsule Take 20 mg by mouth daily. 10/16/21  Yes [provider]    Allergies as of 07/17/2022   (No Known Allergies)    Family History  Problem Relation Age of Onset   Breast cancer Neg Hx     Social History   Socioeconomic History   Marital status: Married    Spouse name: Not on file   Number of children: Not on  file   Years of education: Not on file   Highest education level: Not on file  Occupational History   Not on file  Tobacco Use   Smoking status: Never   Smokeless tobacco: Never  Vaping Use   Vaping Use: Never used  Substance and Sexual Activity   Alcohol use: Never   Drug use: Never   Sexual activity: Not on file  Other Topics Concern   Not on file  Social History Narrative   Not on file   Social Determinants of Health   Financial Resource Strain: Not on file  Food Insecurity: Not on file  Transportation Needs: Not on file  Physical Activity: Not on file  Stress: Not on file  Social Connections: Not on file  Intimate Partner Violence: Not on file    Review of Systems: See HPI, otherwise negative ROS  Physical Exam: BP (!) 143/73   Pulse 70   Temp (!) 96.9 F (36.1 C) (Temporal)   Resp 18   Ht 5\' 1"  (1.549 m)   Wt 68 kg   SpO2 97%   BMI 28.34 kg/m  General:   Alert,  pleasant and cooperative in NAD Head:  Normocephalic and atraumatic. Neck:  Supple; no masses or thyromegaly. Lungs:  Clear throughout to auscultation.    Heart:  Regular rate and rhythm. Abdomen:  Soft, nontender and nondistended. Normal bowel sounds, without guarding, and without rebound.   Neurologic:  Alert and  oriented x4;  grossly normal neurologically.  Impression/Plan: Jessica Holden is here for an colonoscopy to be performed for colon cancer screening  Risks, benefits, limitations, and alternatives regarding colonoscopy have been reviewed with the patient.  Questions have been answered.  All parties agreeable.   Sherri Sear, MD  08/07/2022, 10:51 AM

## 2022-08-07 NOTE — Anesthesia Postprocedure Evaluation (Signed)
Anesthesia Post Note  Patient: Indica Norbeck  Procedure(s) Performed: COLONOSCOPY WITH PROPOFOL ESOPHAGOGASTRODUODENOSCOPY (EGD)  Patient location during evaluation: PACU Anesthesia Type: General Level of consciousness: awake and awake and alert Pain management: satisfactory to patient Vital Signs Assessment: post-procedure vital signs reviewed and stable Respiratory status: nonlabored ventilation Cardiovascular status: stable Anesthetic complications: no  No notable events documented.   Last Vitals:  Vitals:   08/07/22 1158 08/07/22 1208  BP: 99/66 120/74  Pulse: 72 69  Resp: 14 (!) 21  Temp:    SpO2: 97% 100%    Last Pain:  Vitals:   08/07/22 1208  TempSrc:   PainSc: 0-No pain                 VAN STAVEREN,Bodhi Moradi

## 2022-08-07 NOTE — Op Note (Signed)
[facility] Patient Name: Jessica Holden Procedure Date: 08/07/2022 10:54 AM MRN: [id number] Account #: [id number] Date of Birth: August 26, 1961 Admit Type: Outpatient Age: 61 Room: [facility] ENDO ROOM 1 Gender: Female Note Status: Finalized Instrument Name: Jasper Riling [id number] Procedure:             Colonoscopy Indications:           Screening for colorectal malignant neoplasm, This is                         the patient's first colonoscopy Providers:             [name] MD, MD Referring MD:          Bridget Hartshorn j. [facility], dr (Referring MD) Medicines:             General Anesthesia Complications:         No immediate complications. Estimated blood loss: None. Procedure:             Pre-Anesthesia Assessment:                        - Prior to the procedure, a History and Physical was                         performed, and patient medications and allergies were                         reviewed. The patient is competent. The risks and                         benefits of the procedure and the sedation options and                         risks were discussed with the patient. All questions                         were answered and informed consent was obtained.                         Patient identification and proposed procedure were                         verified by the physician, the nurse, the                         anesthesiologist, the anesthetist and the technician                         in the pre-procedure area in the procedure room in the                         endoscopy suite. Mental Status Examination: alert and                         oriented. Airway Examination: normal oropharyngeal                         airway and neck mobility. Respiratory Examination:  clear to auscultation. CV Examination: normal.                         Prophylactic Antibiotics: The patient does not require                          prophylactic antibiotics. Prior Anticoagulants: The                         patient has taken no anticoagulant or antiplatelet                         agents. ASA Grade Assessment: II - A patient with mild                         systemic disease. After reviewing the risks and                         benefits, the patient was deemed in satisfactory                         condition to undergo the procedure. The anesthesia                         plan was to use general anesthesia. Immediately prior                         to administration of medications, the patient was                         re-assessed for adequacy to receive sedatives. The                         heart rate, respiratory rate, oxygen saturations,                         blood pressure, adequacy of pulmonary ventilation, and                         response to care were monitored throughout the                         procedure. The physical status of the patient was                         re-assessed after the procedure.                        After obtaining informed consent, the colonoscope was                         passed under direct vision. Throughout the procedure,                         the patient's blood pressure, pulse, and oxygen                         saturations were monitored continuously. The  Colonoscope was introduced through the anus and                         advanced to the the terminal ileum, with                         identification of the appendiceal orifice and IC                         valve. The colonoscopy was performed with moderate                         difficulty due to significant looping. Successful                         completion of the procedure was aided by applying                         abdominal pressure. The patient tolerated the                         procedure well. The quality of the bowel preparation                         was  evaluated using the BBPS [facility] Bowel Preparation                         Scale) with scores of: Right Colon = 3, Transverse                         Colon = 3 and Left Colon = 3 (entire mucosa seen well                         with no residual staining, small fragments of stool or                         opaque liquid). The total BBPS score equals 9. The                         terminal ileum, ileocecal valve, appendiceal orifice,                         and rectum were photographed. Findings:      The perianal and digital rectal examinations were normal. Pertinent       negatives include normal sphincter tone and no palpable rectal lesions.      The entire examined colon appeared normal.      The retroflexed view of the distal rectum and anal verge was normal and       showed no anal or rectal abnormalities. Impression:            - The entire examined colon is normal.                        - The distal rectum and anal verge are normal on                         retroflexion view.                        -  No specimens collected. Recommendation:        - Discharge patient to home (with escort).                        - Resume previous diet today.                        - Continue present medications.                        - Repeat colonoscopy in 10 years for screening                         purposes. Procedure Code(s):     --- Professional ---                        Q3300, Colorectal cancer screening; colonoscopy on                         individual not meeting criteria for high risk Diagnosis Code(s):     --- Professional ---                        Z12.11, Encounter for screening for malignant neoplasm                         of colon CPT copyright 2022 American [employer] Association. All rights reserved. The codes documented in this report are preliminary and upon coder review may  be revised to meet current compliance requirements. Dr. Libby Maw Toney Reil MD,  MD 08/07/2022 11:43:43 AM This report has been signed electronically. Number of Addenda: 0 Note Initiated On: 08/07/2022 10:54 AM Scope Withdrawal Time: 0 hours 12 minutes 43 seconds  Total Procedure Duration: 0 hours 19 minutes 9 seconds  Estimated Blood Loss:  Estimated blood loss: none.      [facility]

## 2022-08-07 NOTE — Anesthesia Preprocedure Evaluation (Signed)
Anesthesia Evaluation  Patient identified by MRN, date of birth, ID band Patient awake    Reviewed: Allergy & Precautions, NPO status , Patient's Chart, lab work & pertinent test results  Airway Mallampati: II  TM Distance: >3 FB Neck ROM: Full    Dental  (+) Teeth Intact   Pulmonary neg pulmonary ROS   Pulmonary exam normal breath sounds clear to auscultation       Cardiovascular Exercise Tolerance: Good negative cardio ROS Normal cardiovascular exam Rhythm:Regular Rate:Normal     Neuro/Psych negative neurological ROS  negative psych ROS   GI/Hepatic negative GI ROS, Neg liver ROS,GERD  Medicated,,  Endo/Other  negative endocrine ROS    Renal/GU negative Renal ROS  negative genitourinary   Musculoskeletal negative musculoskeletal ROS (+)    Abdominal Normal abdominal exam  (+)   Peds negative pediatric ROS (+)  Hematology negative hematology ROS (+)   Anesthesia Other Findings Past Medical History: No date: Gallstone pancreatitis No date: GERD (gastroesophageal reflux disease) No date: Hyperlipidemia No date: Pre-diabetes  Past Surgical History: 10/19/2011: ARTHROSCOPIC ROTATOR CUFF REPAIR; Right 11/05/2021: ROBOTIC ASSISTED LAPAROSCOPIC CHOLECYSTECTOMY; N/A 96/28/3662: UMBILICAL HERNIA REPAIR; N/A     Comment:  Procedure: OPEN HERNIA REPAIR UMBILICAL ADULT;  Surgeon:              Olean Ree, MD;  Location: [facility] ORS;  Service:               General;  Laterality: N/A;  BMI    Body Mass Index: 28.34 kg/m      Reproductive/Obstetrics negative OB ROS                             Anesthesia Physical Anesthesia Plan  ASA: 2  Anesthesia Plan: General   Post-op Pain Management:    Induction: Intravenous  PONV Risk Score and Plan: Propofol infusion and TIVA  Airway Management Planned: Natural Airway  Additional Equipment:   Intra-op Plan:   Post-operative Plan:    Informed Consent: I have reviewed the patients History and Physical, chart, labs and discussed the procedure including the risks, benefits and alternatives for the proposed anesthesia with the patient or authorized representative who has indicated his/her understanding and acceptance.     Dental Advisory Given  Plan Discussed with: CRNA and Surgeon  Anesthesia Plan Comments:        Anesthesia Quick Evaluation

## 2022-08-07 NOTE — OR Nursing (Signed)
Patient prefers to wait and get preapproval from [employer] for [employer] (EGD).  Only Colonoscopy to be done today.

## 2022-08-07 NOTE — Transfer of Care (Addendum)
Immediate Anesthesia Transfer of Care Note  Patient: Jessica Holden  Procedure(s) Performed: COLONOSCOPY WITH PROPOFOL ESOPHAGOGASTRODUODENOSCOPY (EGD)  Patient Location: PACU and Endoscopy Unit  Anesthesia Type:MAC  Level of Consciousness: drowsy  Airway & Oxygen Therapy: Patient Spontanous Breathing and Patient connected to nasal cannula oxygen  Post-op Assessment: Report given to RN and Post -op Vital signs reviewed and stable  Post vital signs: Reviewed and stable  Last Vitals:  Vitals Value Taken Time  BP    Temp    Pulse    Resp    SpO2      Last Pain:  Vitals:   08/07/22 1038  TempSrc: Temporal  PainSc:          Complications: No notable events documented.

## 2022-08-08 NOTE — Progress Notes (Signed)
Voicemail.  No Message Left. 

## 2022-08-10 ENCOUNTER — Telehealth: Payer: Self-pay

## 2022-08-10 ENCOUNTER — Encounter: Payer: Self-pay | Admitting: Gastroenterology

## 2022-08-10 NOTE — Telephone Encounter (Signed)
Used interpreter services ID number [id number] and patient states she does not know if she wants to have the EGD done. She states she is going to talk to her husband and give [place] a call back if she wants to schedule the EGD

## 2022-08-10 NOTE — Telephone Encounter (Signed)
-----   Message from [name], [name] sent at 08/07/2022 11:14 AM EDT ----- Regarding: FW: EGD  ----- Message ----- From: [name], MD Sent: 08/07/2022  11:11 AM EDT To: [name], CMA Subject: EGD                                            Please call pt to schedule EGD after prior auth. She didn't want to undergo today along with colonoscopy Family history of gastric cancer in her nephew at age 60 and dyspepsia  Jessica Holden

## 2023-04-01 ENCOUNTER — Ambulatory Visit: Payer: 59 | Admitting: Dermatology

## 2023-04-19 IMAGING — US US ABDOMEN LIMITED
1 series · 14 of 25 positions shown · non-contrast
Comparison: None

CLINICAL DATA: Provided history: Right upper quadrant abdominal
pain. Additional history provided by scanning technologist: Patient
reports right upper quadrant abdominal pain since [DATE] a.m.

EXAM:
ULTRASOUND ABDOMEN LIMITED RIGHT UPPER QUADRANT

[Series 1: us abdomen limited ruq (liver/gb) · 14 of 52 slices shown]
[im 1/52]
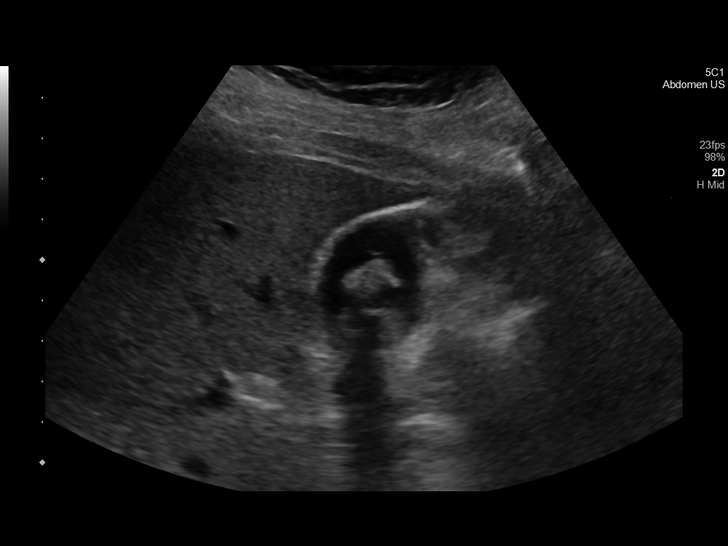
[im 5/52]
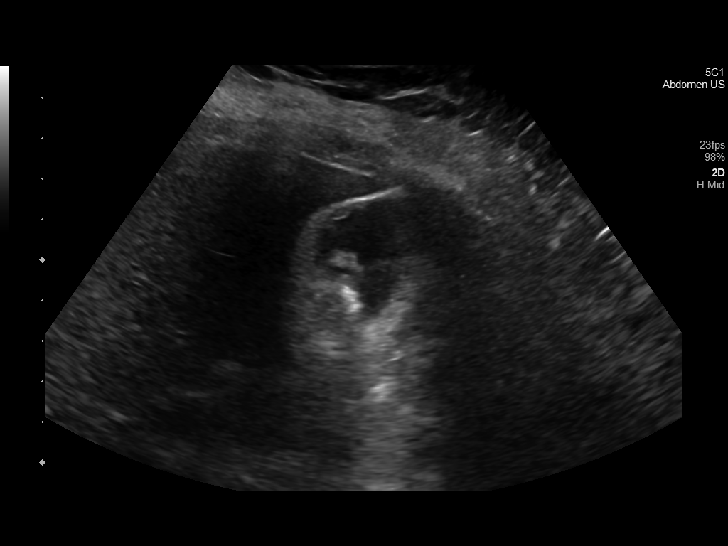
[im 9/52]
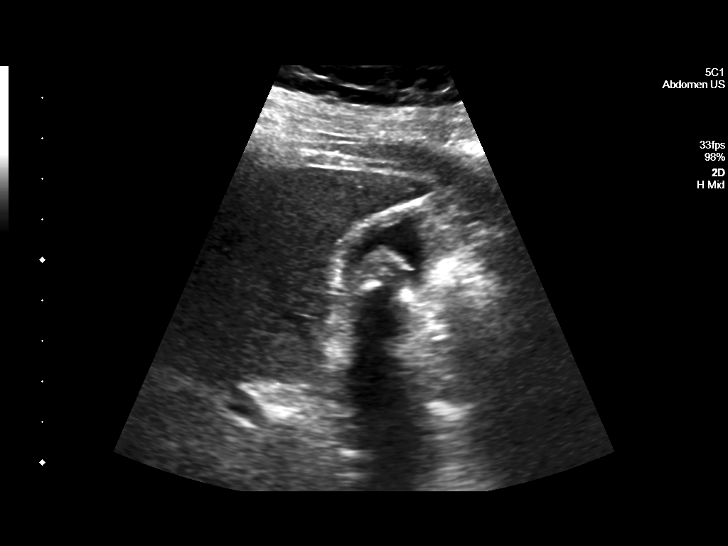
[im 13/52]
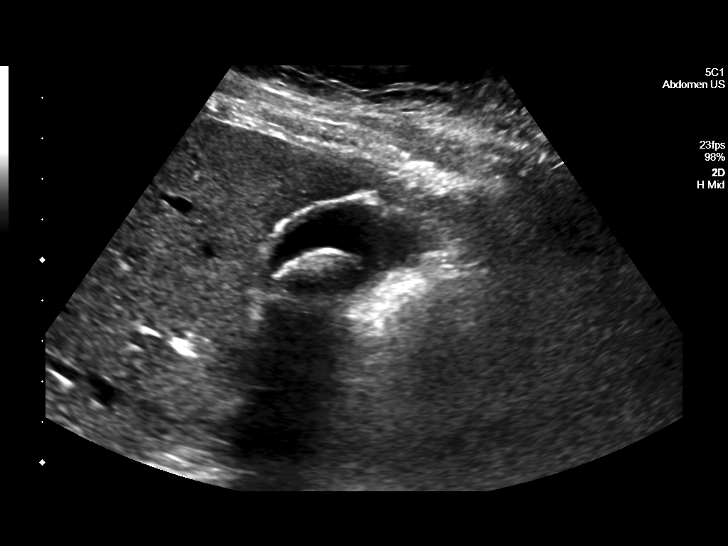
[im 18/52]
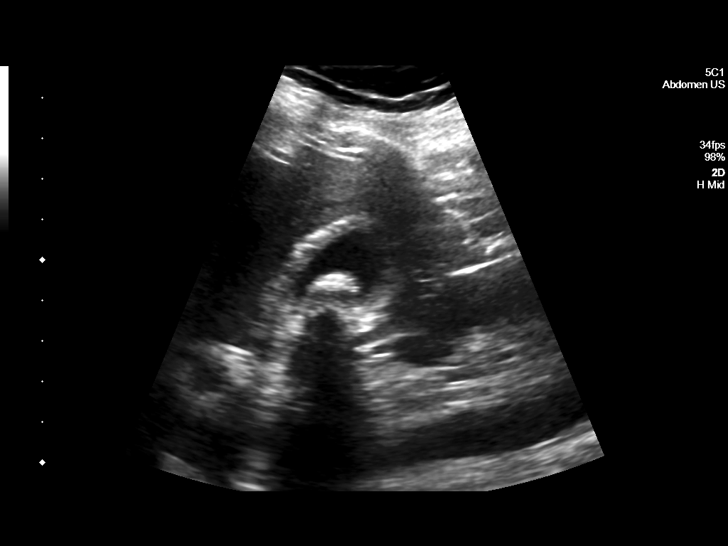
[im 20/52]
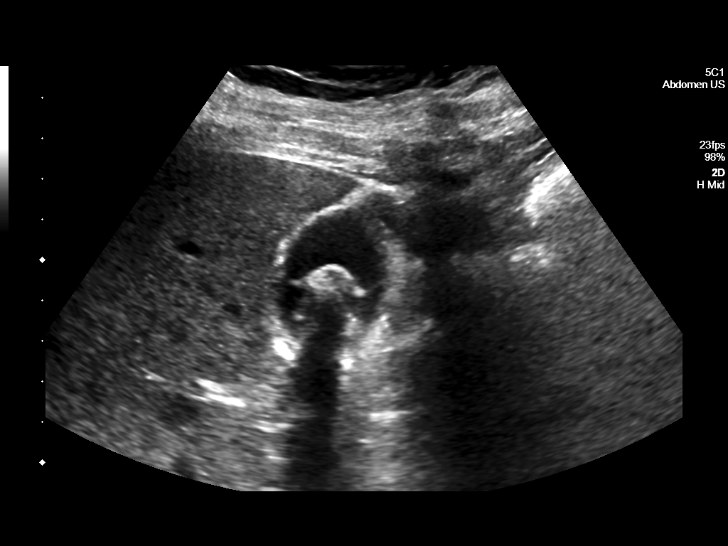
[im 24/52]
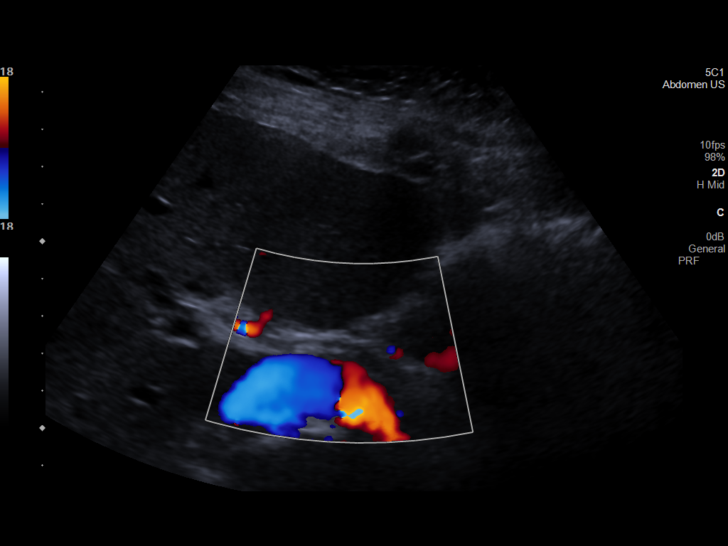
[im 28/52]
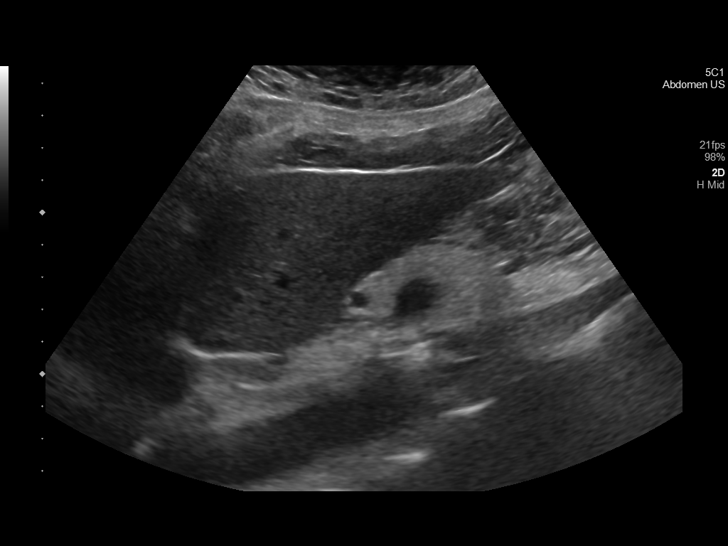
[im 32/52]
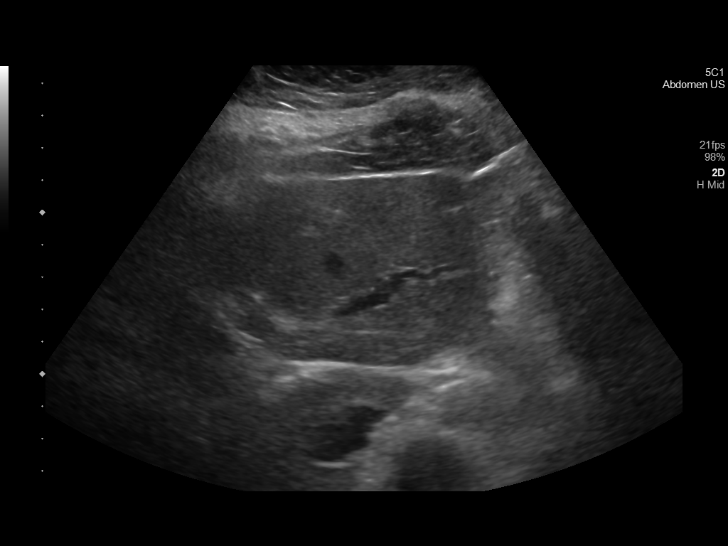
[im 35/52]
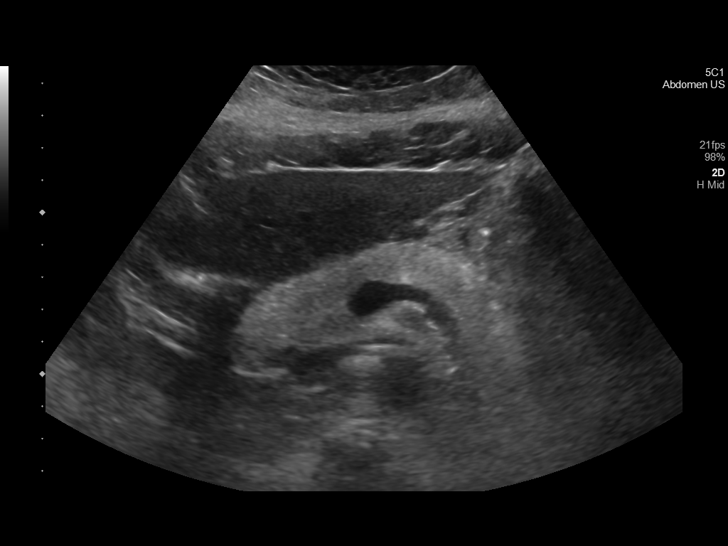
[im 39/52]
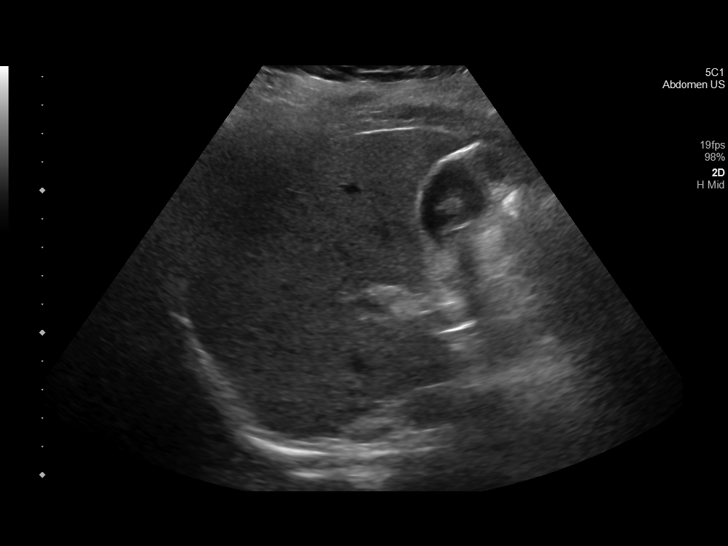
[im 43/52]
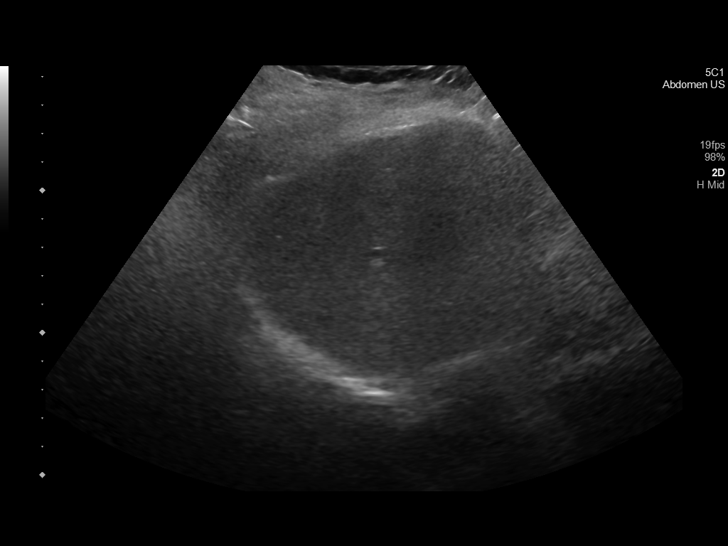
[im 47/52]
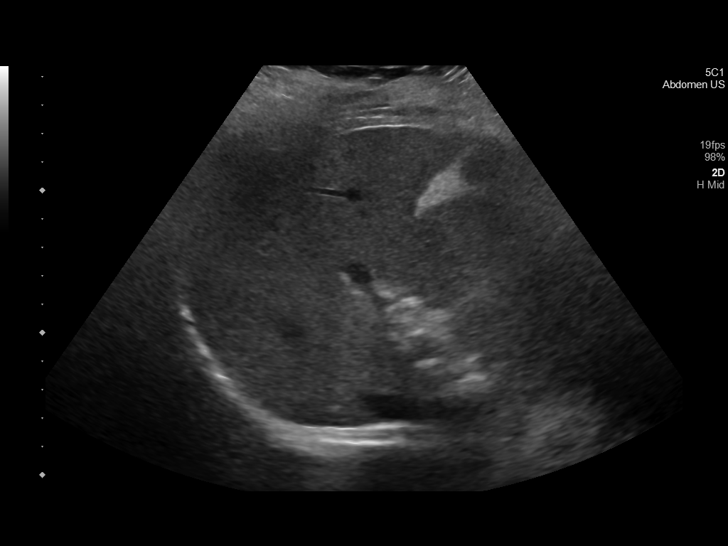
[im 52/52]
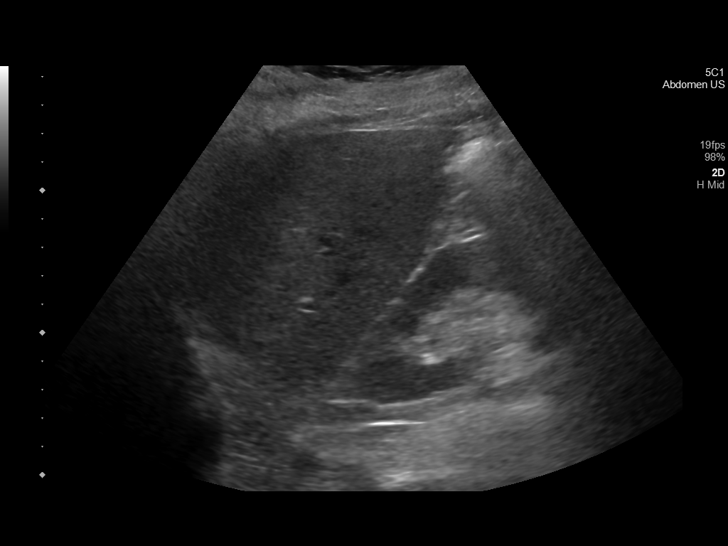

[14 of 25 positions shown; findings below may reference images not displayed]

FINDINGS: Gallbladder:

Cholecystolithiasis. No appreciable gallbladder wall thickening. No
sonographic Murphy sign is noted by the scanning technologist.

Common bile duct:

Diameter: 2-3 mm, within normal limits.

Liver:

No focal lesion identified. Within normal limits in parenchymal
echogenicity. Portal vein is patent on color Doppler imaging with
normal direction of blood flow towards the liver.
IMPRESSION: Cholecystolithiasis.

Otherwise unremarkable right upper quadrant ultrasound, as
described.

## 2023-04-19 IMAGING — CR DG CHEST 2V
1 series · 2 of 2 positions shown · non-contrast
Comparison: Chest radiograph dated September 18, 2018

CLINICAL DATA: Epigastric pain

EXAM:
CHEST - 2 VIEW

[Series 1: dg chest 2 view · 0.14mm/px · 2 of 2 slices shown]
[im 1/2]
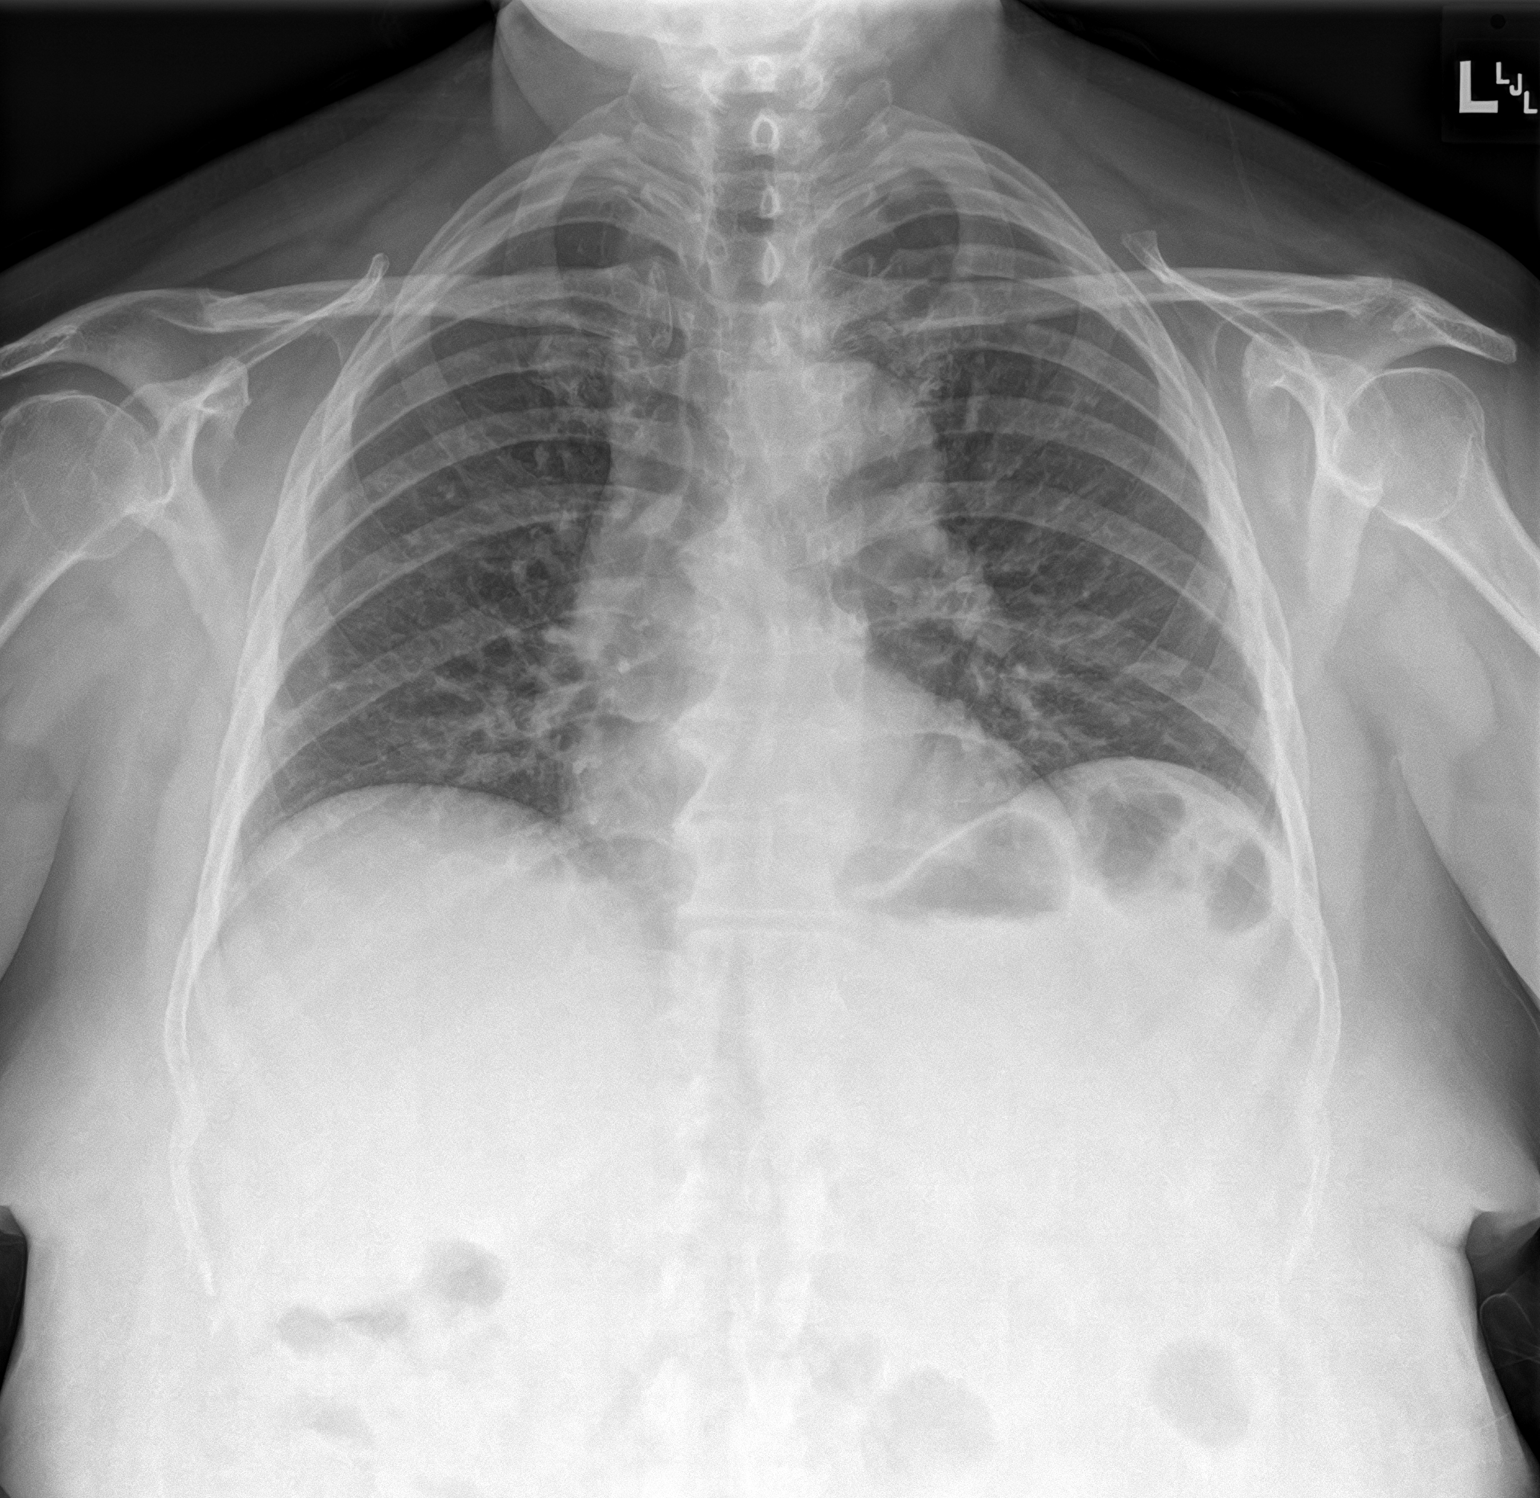
[im 2/2]
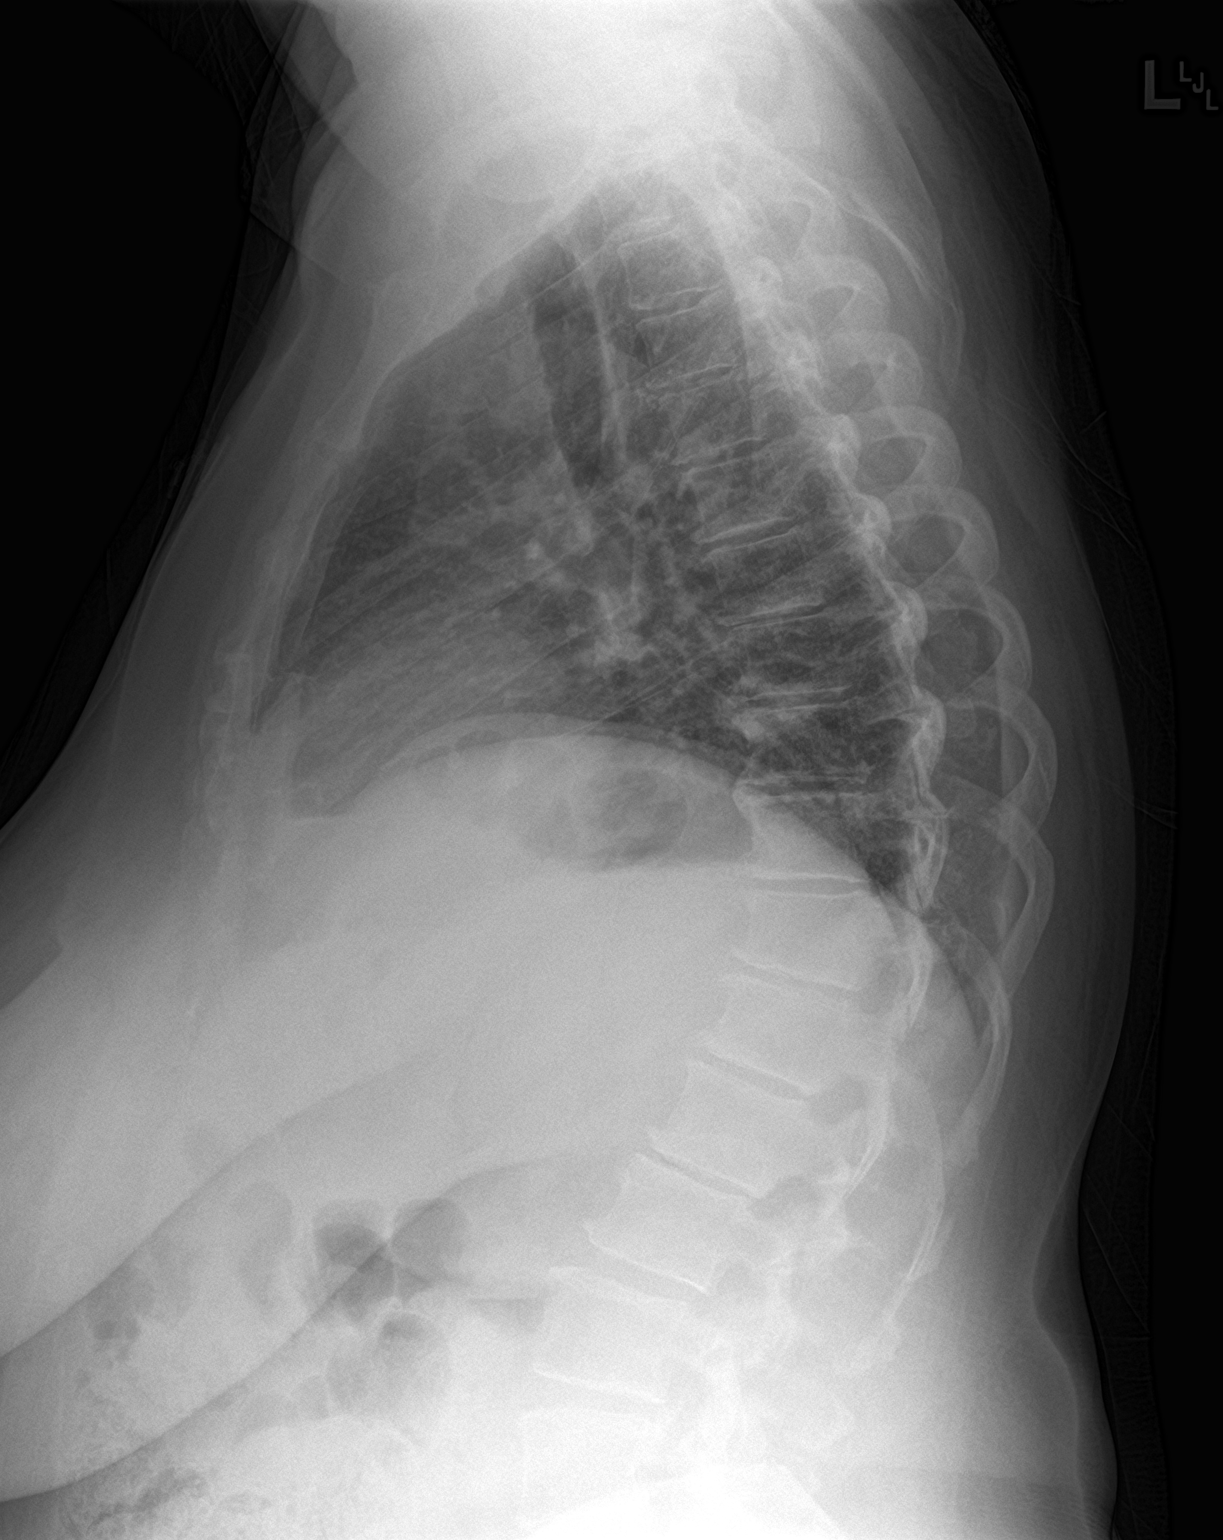

[2 of 2 positions shown; findings below may reference images not displayed]

FINDINGS: The heart size and mediastinal contours are within normal limits.
Low lung volumes without focal consolidation or pleural effusion.
The visualized skeletal structures are unremarkable.
IMPRESSION: No active cardiopulmonary disease.

## 2023-04-19 IMAGING — CT CT ABD-PELV W/ CM
2 of 5 series · 16 of 46 positions shown, 18 images · IV contrast (APPLIED)
Comparison: Abdominal ultrasound earlier the same day

CLINICAL DATA: Abdominal pain, pancreatitis

EXAM:
CT ABDOMEN AND PELVIS WITH CONTRAST
TECHNIQUE: Multidetector CT imaging of the abdomen and pelvis was performed
using the standard protocol following bolus administration of
intravenous contrast.

[Series 2: axial st · axial · 0.75mm/px · z∈[-872,-497]mm · 13 of 85 slices shown, 15 images]
[im 5/85  soft-tissue]
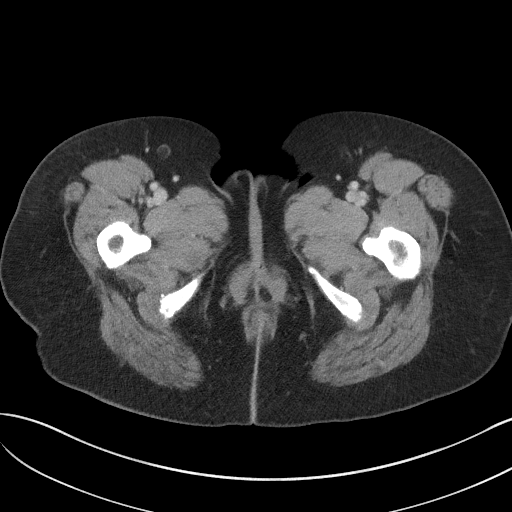
[im 5/85  bone]
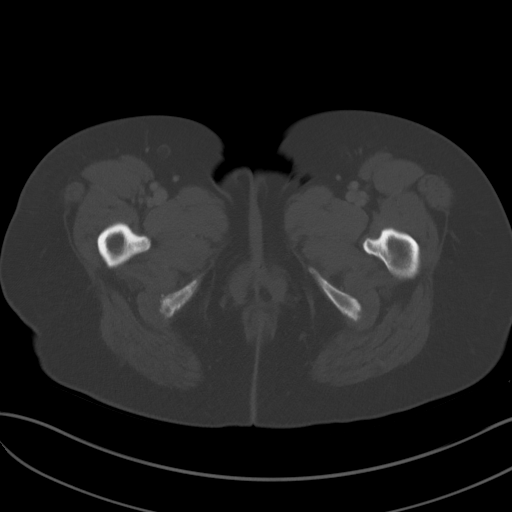
[im 10/85  soft-tissue]
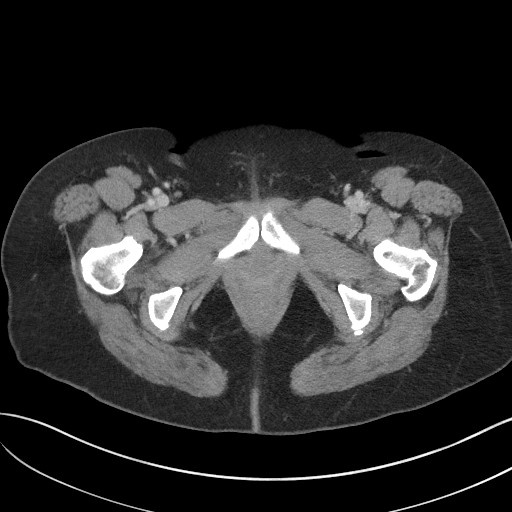
[im 20/85  soft-tissue]
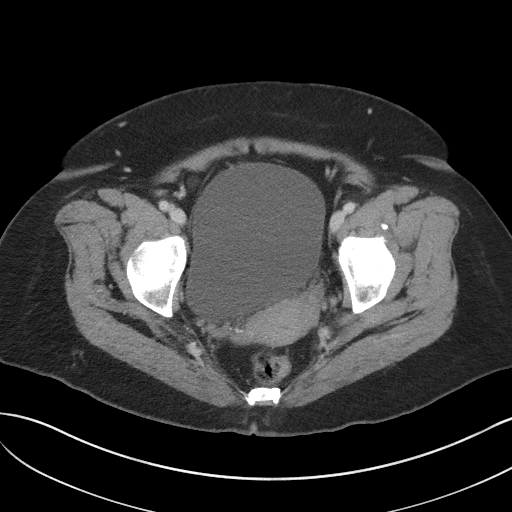
[im 25/85  soft-tissue]
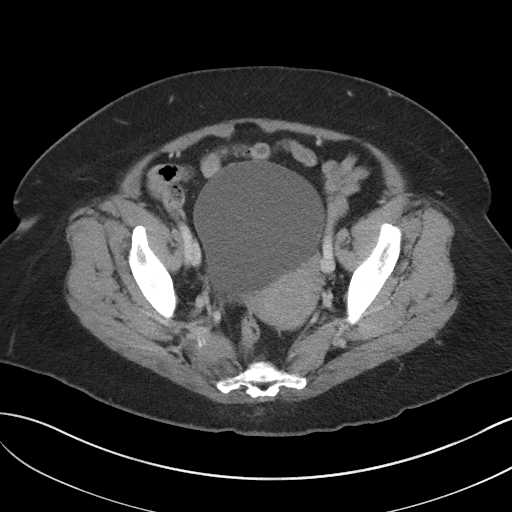
[im 30/85  soft-tissue]
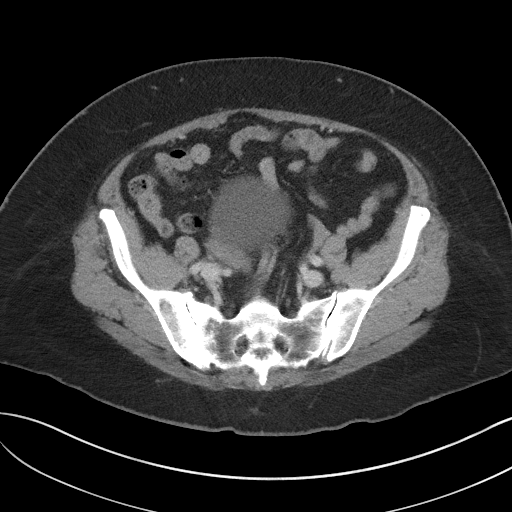
[im 35/85  soft-tissue]
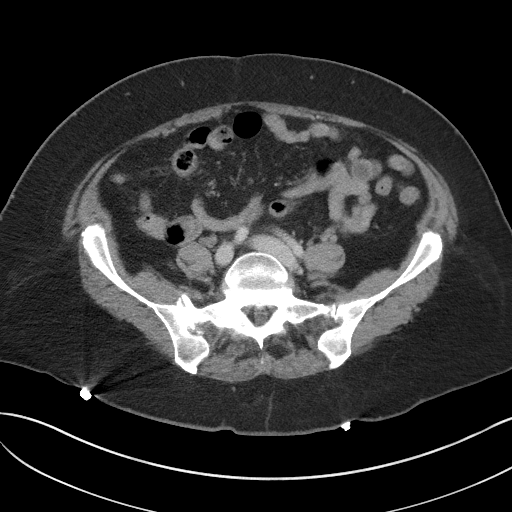
[im 45/85  soft-tissue]
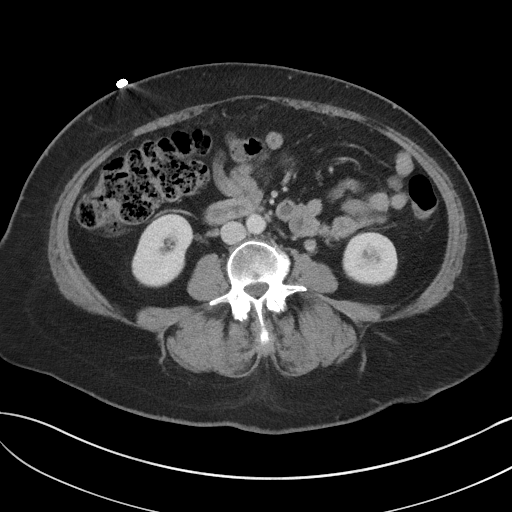
[im 50/85  soft-tissue]
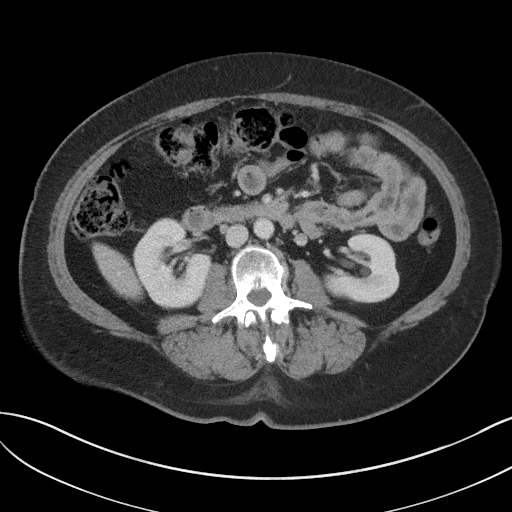
[im 55/85  soft-tissue]
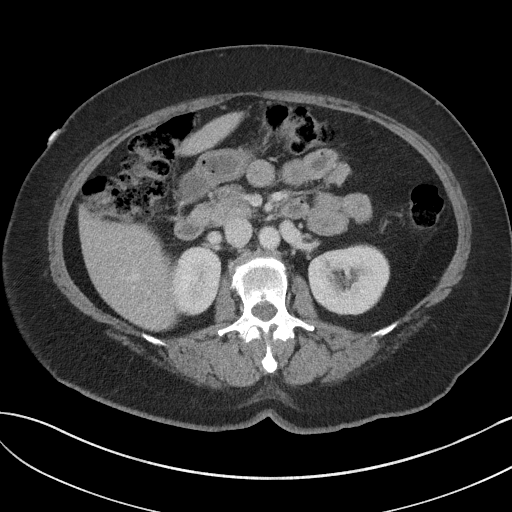
[im 55/85  bone]
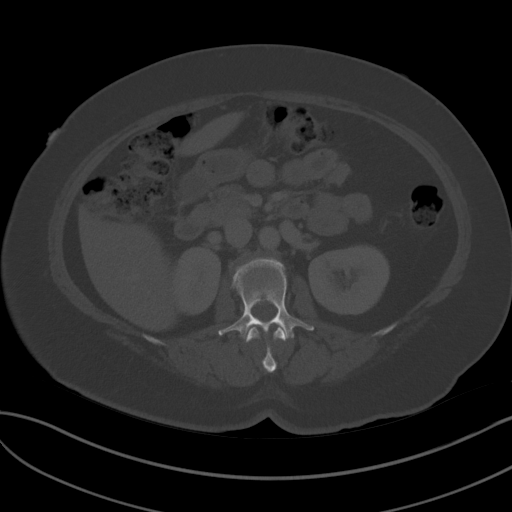
[im 60/85  soft-tissue]
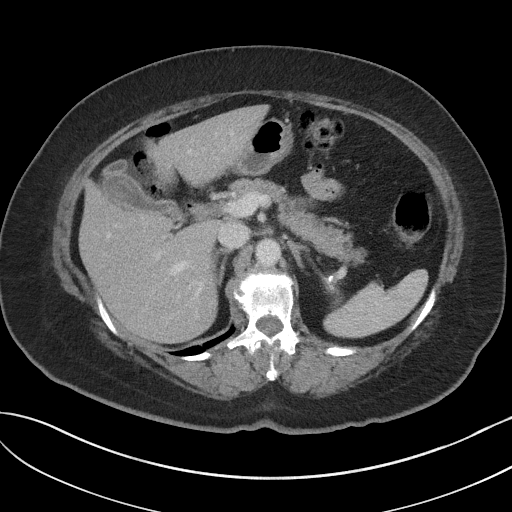
[im 65/85  soft-tissue]
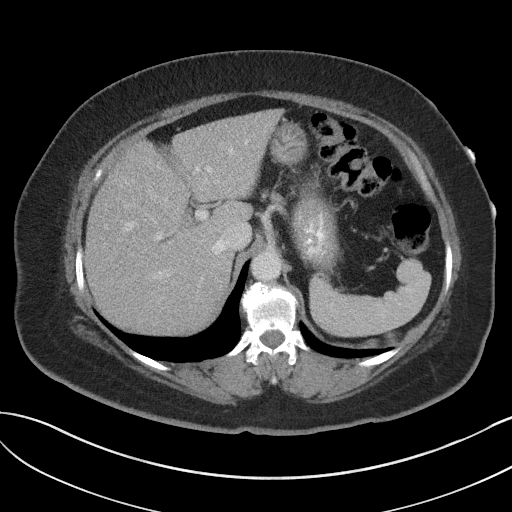
[im 75/85  soft-tissue]
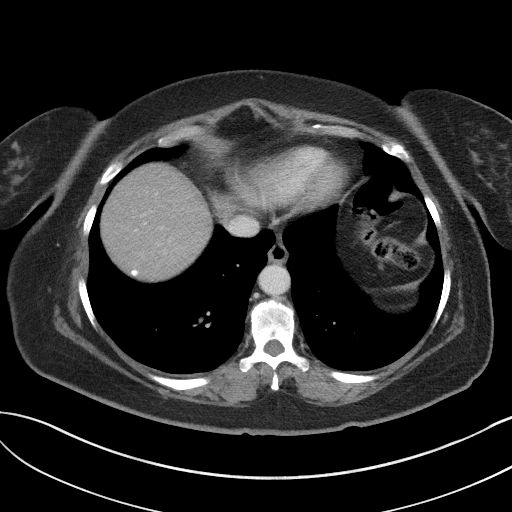
[im 80/85  soft-tissue]
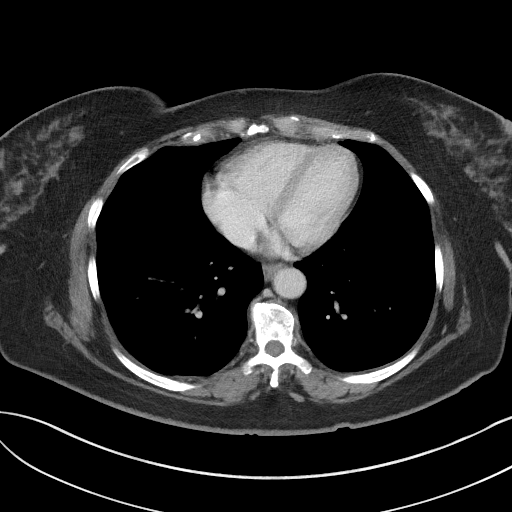

[Series 5: coronal st · coronal · 0.75mm/px · 3 of 92 slices shown]
[im 31/92  soft-tissue]
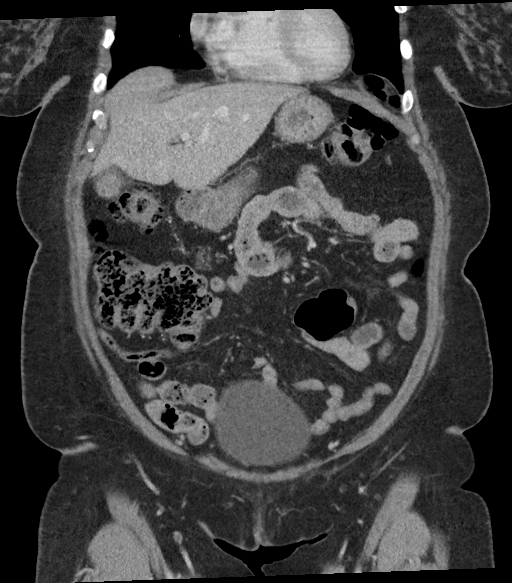
[im 41/92  soft-tissue]
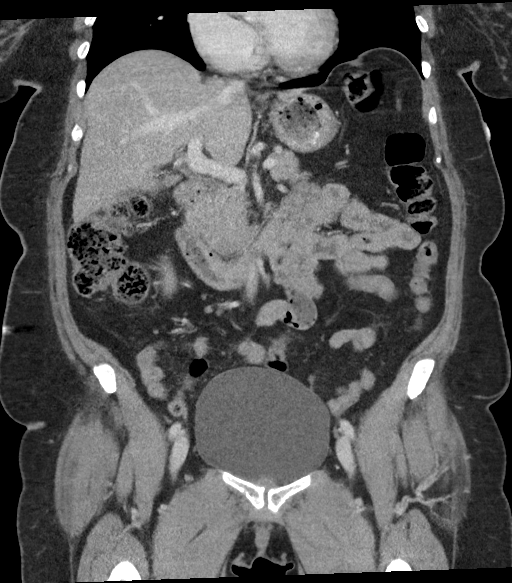
[im 51/92  soft-tissue]
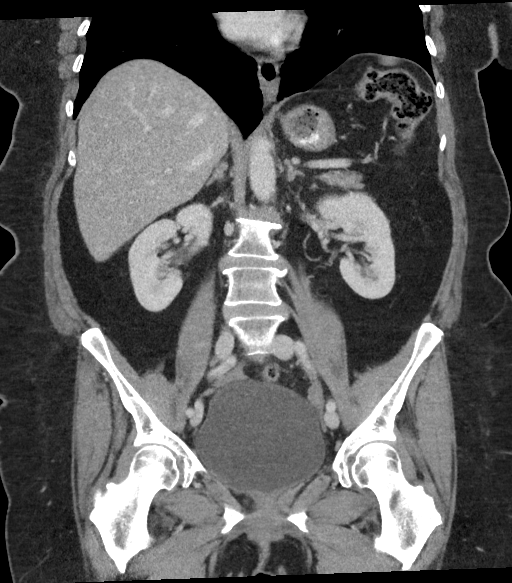

[16 of 46 positions shown; findings below may reference images not displayed]

RADIATION DOSE REDUCTION: This exam was performed according to the
departmental dose-optimization program which includes automated
exposure control, adjustment of the mA and/or kV according to
patient size and/or use of iterative reconstruction technique.

CONTRAST:  100mL OMNIPAQUE IOHEXOL 300 MG/ML  SOLN
FINDINGS: Lower chest: No acute abnormality.

Hepatobiliary: Liver is normal in size and contour with no
suspicious mass identified. There is mild gallbladder wall
thickening and pericholecystic fat stranding edema. Evidence of
cholelithiasis. No biliary ductal dilatation.

Pancreas: Unremarkable. No pancreatic ductal dilatation or
surrounding inflammatory changes.

Spleen: Normal in size without focal abnormality.

Adrenals/Urinary Tract: Adrenal glands are unremarkable. Kidneys are
normal, without renal calculi, focal lesion, or hydronephrosis.
Bladder is unremarkable.

Stomach/Bowel: Tiny hiatal hernia. Appendix appears normal. No
evidence of bowel wall thickening, distention, or inflammatory
changes.

Vascular/Lymphatic: No significant vascular findings are present. No
enlarged abdominal or pelvic lymph nodes.

Reproductive: Uterus and bilateral adnexa are unremarkable.

Other: No ascites.

Musculoskeletal: Degenerative changes in the spine. No suspicious
bony lesions identified.
IMPRESSION: 1. Cholelithiasis with mild gallbladder wall thickening and
pericholecystic fat stranding. Correlate for possible acute calculus
cholecystitis.
2. Tiny hiatal hernia.

## 2023-10-07 ENCOUNTER — Other Ambulatory Visit: Payer: Self-pay | Admitting: Family Medicine

## 2023-10-07 DIAGNOSIS — Z1231 Encounter for screening mammogram for malignant neoplasm of breast: Secondary | ICD-10-CM

## 2024-06-07 ENCOUNTER — Other Ambulatory Visit: Payer: Self-pay | Admitting: Family Medicine

## 2024-06-07 DIAGNOSIS — Z1231 Encounter for screening mammogram for malignant neoplasm of breast: Secondary | ICD-10-CM

## 2024-07-28 ENCOUNTER — Encounter

## 2024-08-01 ENCOUNTER — Ambulatory Visit
Admission: RE | Admit: 2024-08-01 | Discharge: 2024-08-01 | Disposition: A | Discharge status: Home / Self Care | Source: Ambulatory Visit | Attending: Family Medicine | Admitting: Family Medicine

## 2024-08-01 DIAGNOSIS — Z1231 Encounter for screening mammogram for malignant neoplasm of breast: Secondary | ICD-10-CM | POA: Diagnosis present
